# Patient Record
Sex: Female | Born: 1985 | Race: Black or African American | Hispanic: No | Marital: Married | Smoking: Never smoker
Health system: Southern US, Community
[De-identification: ages and names within clinical notes are randomized; demographics above are authoritative.]

## PROBLEM LIST (undated history)

## (undated) ENCOUNTER — Emergency Department: Admission: EM | Payer: Medicaid Other | Source: Home / Self Care

## (undated) ENCOUNTER — Emergency Department: Admission: EM | Payer: Self-pay | Source: Home / Self Care

## (undated) DIAGNOSIS — M5430 Sciatica, unspecified side: Secondary | ICD-10-CM

## (undated) DIAGNOSIS — B20 Human immunodeficiency virus [HIV] disease: Secondary | ICD-10-CM

## (undated) DIAGNOSIS — J45909 Unspecified asthma, uncomplicated: Secondary | ICD-10-CM

## (undated) DIAGNOSIS — Z21 Asymptomatic human immunodeficiency virus [HIV] infection status: Secondary | ICD-10-CM

---
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
## 2015-08-09 ENCOUNTER — Encounter: Payer: Self-pay | Admitting: Emergency Medicine

## 2015-08-09 ENCOUNTER — Emergency Department
Admission: EM | Admit: 2015-08-09 | Discharge: 2015-08-10 | Disposition: A | Discharge status: Home / Self Care | Payer: Medicaid Other | Attending: Emergency Medicine | Admitting: Emergency Medicine

## 2015-08-09 DIAGNOSIS — Z87891 Personal history of nicotine dependence: Secondary | ICD-10-CM | POA: Diagnosis not present

## 2015-08-09 DIAGNOSIS — N390 Urinary tract infection, site not specified: Secondary | ICD-10-CM | POA: Diagnosis not present

## 2015-08-09 DIAGNOSIS — Z3202 Encounter for pregnancy test, result negative: Secondary | ICD-10-CM | POA: Diagnosis not present

## 2015-08-09 DIAGNOSIS — R1030 Lower abdominal pain, unspecified: Secondary | ICD-10-CM | POA: Diagnosis present

## 2015-08-09 HISTORY — DX: Sciatica, unspecified side: M54.30

## 2015-08-09 HISTORY — DX: Unspecified asthma, uncomplicated: J45.909

## 2015-08-09 LAB — CBC
HCT: 37.9 % (ref 35.0–47.0)
Hemoglobin: 12.9 g/dL (ref 12.0–16.0)
MCH: 28.9 pg (ref 26.0–34.0)
MCHC: 34 g/dL (ref 32.0–36.0)
MCV: 84.8 fL (ref 80.0–100.0)
Platelets: 356 10*3/uL (ref 150–440)
RBC: 4.47 MIL/uL (ref 3.80–5.20)
RDW: 12.2 % (ref 11.5–14.5)
WBC: 8.1 10*3/uL (ref 3.6–11.0)

## 2015-08-09 LAB — COMPREHENSIVE METABOLIC PANEL
ALBUMIN: 4.1 g/dL (ref 3.5–5.0)
ALK PHOS: 79 U/L (ref 38–126)
ALT: 11 U/L — ABNORMAL LOW (ref 14–54)
ANION GAP: 6 (ref 5–15)
AST: 21 U/L (ref 15–41)
BILIRUBIN TOTAL: 0.4 mg/dL (ref 0.3–1.2)
BUN: 13 mg/dL (ref 6–20)
CALCIUM: 9.1 mg/dL (ref 8.9–10.3)
CO2: 25 mmol/L (ref 22–32)
Chloride: 105 mmol/L (ref 101–111)
Creatinine, Ser: 0.72 mg/dL (ref 0.44–1.00)
GFR calc Af Amer: >60 mL/min (ref >60)
GLUCOSE: 124 mg/dL — AB (ref 65–99)
Potassium: 3.8 mmol/L (ref 3.5–5.1)
Sodium: 136 mmol/L (ref 135–145)
TOTAL PROTEIN: 8.5 g/dL — AB (ref 6.5–8.1)

## 2015-08-09 LAB — LIPASE, BLOOD: Lipase: 15 U/L (ref 11–51)

## 2015-08-09 NOTE — ED Notes (Signed)
Took tylenol #3 at 430 with no relief.  Mid abd pain constant with low cramps

## 2015-08-10 ENCOUNTER — Emergency Department: Payer: Medicaid Other

## 2015-08-10 LAB — URINALYSIS COMPLETE WITH MICROSCOPIC (ARMC ONLY)
Bilirubin Urine: NEGATIVE
GLUCOSE, UA: NEGATIVE mg/dL
Hgb urine dipstick: NEGATIVE
KETONES UR: NEGATIVE mg/dL
Nitrite: POSITIVE — AB
Protein, ur: NEGATIVE mg/dL
RBC / HPF: NONE SEEN RBC/hpf (ref 0–5)
Specific Gravity, Urine: 1.032 — ABNORMAL HIGH (ref 1.005–1.030)
pH: 5 (ref 5.0–8.0)

## 2015-08-10 LAB — POCT PREGNANCY, URINE: Preg Test, Ur: NEGATIVE

## 2015-08-10 MED ORDER — IOHEXOL 240 MG/ML SOLN
25.0000 mL | Freq: Once | INTRAMUSCULAR | Status: AC | PRN
  Administered 2015-08-10: 25 mL via ORAL

## 2015-08-10 MED ORDER — CEFTRIAXONE SODIUM 1 G IJ SOLR
1.0000 g | Freq: Once | INTRAMUSCULAR | Status: AC
  Administered 2015-08-10: 1 g via INTRAVENOUS
  Filled 2015-08-10: qty 10

## 2015-08-10 MED ORDER — METOCLOPRAMIDE HCL 10 MG PO TABS: 10.0000 mg | ORAL_TABLET | Freq: Three times a day (TID) | ORAL | Status: DC | PRN

## 2015-08-10 MED ORDER — SODIUM CHLORIDE 0.9 % IV BOLUS (SEPSIS)
1000.0000 mL | Freq: Once | INTRAVENOUS | Status: AC
  Administered 2015-08-10: 1000 mL via INTRAVENOUS

## 2015-08-10 MED ORDER — IOHEXOL 300 MG/ML  SOLN
125.0000 mL | Freq: Once | INTRAMUSCULAR | Status: AC | PRN
  Administered 2015-08-10: 125 mL via INTRAVENOUS

## 2015-08-10 MED ORDER — ONDANSETRON HCL 4 MG/2ML IJ SOLN
4.0000 mg | Freq: Once | INTRAMUSCULAR | Status: AC
  Administered 2015-08-10: 4 mg via INTRAVENOUS
  Filled 2015-08-10: qty 2

## 2015-08-10 MED ORDER — MORPHINE SULFATE (PF) 4 MG/ML IV SOLN
4.0000 mg | Freq: Once | INTRAVENOUS | Status: AC
  Administered 2015-08-10: 4 mg via INTRAVENOUS
  Filled 2015-08-10: qty 1

## 2015-08-10 MED ORDER — CEPHALEXIN 500 MG PO CAPS: 500.0000 mg | ORAL_CAPSULE | Freq: Four times a day (QID) | ORAL | Status: AC

## 2015-08-10 MED ORDER — TRAMADOL HCL 50 MG PO TABS: 50.0000 mg | ORAL_TABLET | Freq: Four times a day (QID) | ORAL | Status: DC | PRN

## 2015-08-10 NOTE — ED Notes (Signed)
IV attempt to right AC unsuccessful; pt tolerated well; pt to go for CT when inserted; has already finished oral contrast; CT tech understands waiting for IV placement

## 2015-08-10 NOTE — ED Provider Notes (Signed)
[facility] Emergency Department Provider Note  ____________________________________________  Time seen: Approximately 0019 AM  I have reviewed the triage vital signs and the nursing notes.   HISTORY  Chief Complaint Abdominal Pain    HPI Tracy Bauer is a 30 y.o. female who comes into the hospital today with mid to lower abdominal pain. She reports it is a constant pain in her mid abdomen with some cramps in her lower abdomen. She's had some chills and nausea feeling all day. She reports that she feels as though she has to pass gas and burping. She rates her pain a 9 out of 10 in intensity. The pain started yesterday and has not gotten any better. The patient did take some Tylenol No. 3 but it did not help. She decided to come in and get checked out as her husband will be going to work tomorrow. She denies pain with urination diarrhea or vomiting she also denies vaginal discharge. She's never had this pain before and denies sick contacts but her son has been complaining of belly pain that is worse with eating. The patient also reports that her pain is worse with eating.   Past Medical History  Diagnosis Date  . Sciatica   . Asthma     There are no active problems to display for this patient.   Past Surgical History  Procedure Laterality Date  . Cesarean section      Current Outpatient Rx  Name  Route  Sig  Dispense  Refill  . cephALEXin (KEFLEX) 500 MG capsule   Oral   Take 1 capsule (500 mg total) by mouth 4 (four) times daily.   40 capsule   0   . metoCLOPramide (REGLAN) 10 MG tablet   Oral   Take 1 tablet (10 mg total) by mouth every 8 (eight) hours as needed for nausea.   20 tablet   0   . traMADol (ULTRAM) 50 MG tablet   Oral   Take 1 tablet (50 mg total) by mouth every 6 (six) hours as needed.   12 tablet   0     Allergies Nsaids  History reviewed. No pertinent family history.  Social History Social History  Substance Use  Topics  . Smoking status: Former Games developer  . Smokeless tobacco: None  . Alcohol Use: No    Review of Systems Constitutional: No fever/chills Eyes: No visual changes. ENT: No sore throat. Cardiovascular: Denies chest pain. Respiratory: Denies shortness of breath. Gastrointestinal: abdominal pain.   nausea, no vomiting.  No diarrhea.  No constipation. Genitourinary: Negative for dysuria. Musculoskeletal: Negative for back pain. Skin: Negative for rash. Neurological: Negative for headaches, focal weakness or numbness.  10-point ROS otherwise negative.  ____________________________________________   PHYSICAL EXAM:  VITAL SIGNS: ED Triage Vitals  Enc Vitals Group     BP 08/09/15 2037 130/82 mmHg     Pulse Rate 08/09/15 2037 70     Resp 08/09/15 2037 18     Temp 08/09/15 2037 98.4 F (36.9 C)     Temp Source 08/09/15 2037 Oral     SpO2 08/09/15 2037 98 %     Weight 08/09/15 2037 245 lb (111.131 kg)     Height 08/09/15 2037  (1.651 m)     Head Cir --      Peak Flow --      Pain Score 08/09/15 2038 9     Pain Loc --      Pain Edu? --  Excl. in GC? --     Constitutional: Alert and oriented. Well appearing and in moderate distress. Eyes: Conjunctivae are normal. PERRL. EOMI. Head: Atraumatic. Nose: No congestion/rhinnorhea. Mouth/Throat: Mucous membranes are moist.  Oropharynx non-erythematous. Cardiovascular: Normal rate, regular rhythm. Grossly normal heart sounds.  Good peripheral circulation. Respiratory: Normal respiratory effort.  No retractions. Lungs CTAB. Gastrointestinal: Soft with mid and lower abdominal tenderness to palpation. No distention. Positive bowel sounds Musculoskeletal: No lower extremity tenderness nor edema.   Neurologic:  Normal speech and language.  Skin:  Skin is warm, dry and intact.  Psychiatric: Mood and affect are normal.   ____________________________________________   LABS (all labs ordered are listed, but only abnormal  results are displayed)  Labs Reviewed  COMPREHENSIVE METABOLIC PANEL - Abnormal; Notable for the following:    Glucose, Bld 124 (*)    Total Protein 8.5 (*)    ALT 11 (*)    All other components within normal limits  URINALYSIS COMPLETEWITH MICROSCOPIC ([facility] ONLY) - Abnormal; Notable for the following:    Color, Urine YELLOW (*)    APPearance HAZY (*)    Specific Gravity, Urine 1.032 (*)    Nitrite POSITIVE (*)    Leukocytes, UA TRACE (*)    Bacteria, UA MANY (*)    Squamous Epithelial / LPF 0-5 (*)    All other components within normal limits  URINE CULTURE  LIPASE, BLOOD  CBC  POC URINE PREG, ED  POCT PREGNANCY, URINE   ____________________________________________  EKG  None ____________________________________________  RADIOLOGY  CT Abd and Pelvis: No acute process demonstrated in the abdomen or pelvis. No acute evidence of bowel obstruction or inflammation.  ____________________________________________   PROCEDURES  Procedure(s) performed: None  Critical Care performed: No  ____________________________________________   INITIAL IMPRESSION / ASSESSMENT AND PLAN / ED COURSE  Pertinent labs & imaging results that were available during my care of the patient were reviewed by me and considered in my medical decision making (see chart for details).  This is a 30 year old female who comes into the hospital some lower abdominal cramping and mid abdomen pain. 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 She also does have some nitrates in her urine as well as many bacteria but 0-5 white blood cells. I will do a CT scan to evaluate and send a urine culture. I received the results of the patient's urinalysis I will reassess the patient. She received 4 mg of morphine and 4 mg of Zofran.  The patient's CT scan is unremarkable. Again it does appear as though the patient does have a urinary tract infection. I will send the patient's urine for culture and give her some  ceftriaxone and Keflex for home. The patient will be discharged to home to follow-up with the acute care clinic or her primary care physician. ____________________________________________   FINAL CLINICAL IMPRESSION(S) / ED DIAGNOSES  Final diagnoses:  Lower abdominal pain  UTI (lower urinary tract infection)      Rebecka Apley, MD 08/10/15 [phone]

## 2015-08-10 NOTE — Discharge Instructions (Signed)
Abdominal Pain, Adult °Many things can cause abdominal pain. Usually, abdominal pain is not caused by a disease and will improve without treatment. It can often be observed and treated at home. Your health care provider will do a physical exam and possibly order blood tests and X-rays to help determine the seriousness of your pain. However, in many cases, more time must pass before a clear cause of the pain can be found. Before that point, your health care provider may not know if you need more testing or further treatment. °HOME CARE INSTRUCTIONS °Monitor your abdominal pain for any changes. The following actions may help to alleviate any discomfort you are experiencing: °· Only take over-the-counter or prescription medicines as directed by your health care provider. °· Do not take laxatives unless directed to do so by your health care provider. °· Try a clear liquid diet (broth, tea, or water) as directed by your health care provider. Slowly move to a bland diet as tolerated. °SEEK MEDICAL CARE IF: °· You have unexplained abdominal pain. °· You have abdominal pain associated with nausea or diarrhea. °· You have pain when you urinate or have a bowel movement. °· You experience abdominal pain that wakes you in the night. °· You have abdominal pain that is worsened or improved by eating food. °· You have abdominal pain that is worsened with eating fatty foods. °· You have a fever. °SEEK IMMEDIATE MEDICAL CARE IF: °· Your pain does not go away within 2 hours. °· You keep throwing up (vomiting). °· Your pain is felt only in portions of the abdomen, such as the right side or the left lower portion of the abdomen. °· You pass bloody or black tarry stools. °MAKE SURE YOU: °· Understand these instructions. °· Will watch your condition. °· Will get help right away if you are not doing well or get worse. °  °This information is not intended to replace advice given to you by your health care provider. Make sure you discuss  any questions you have with your health care provider. °  °Document Released: 03/23/2005 Document Revised: 03/04/2015 Document Reviewed: 02/20/2013 °Elsevier Interactive Patient Education ©2016 [employer] °Urinary Tract Infection °Urinary tract infections (UTIs) can develop anywhere along your urinary tract. Your urinary tract is your body's drainage system for removing wastes and extra water. Your urinary tract includes two kidneys, two ureters, a bladder, and a urethra. Your kidneys are a pair of bean-shaped organs. Each kidney is about the size of your fist. They are located below your ribs, one on each side of your spine. °CAUSES °Infections are caused by microbes, which are microscopic organisms, including fungi, viruses, and bacteria. These organisms are so small that they can only be seen through a microscope. Bacteria are the microbes that most commonly cause UTIs. °SYMPTOMS  °Symptoms of UTIs may vary by age and gender of the patient and by the location of the infection. Symptoms in young women typically include a frequent and intense urge to urinate and a painful, burning feeling in the bladder or urethra during urination. Older women and men are more likely to be tired, shaky, and weak and have muscle aches and abdominal pain. A fever may mean the infection is in your kidneys. Other symptoms of a kidney infection include pain in your back or sides below the ribs, nausea, and vomiting. °DIAGNOSIS °To diagnose a UTI, your caregiver will ask you about your symptoms. Your caregiver will also ask you to provide a urine sample. The   urine sample will be tested for bacteria and white blood cells. White blood cells are made by your body to help fight infection. °TREATMENT  °Typically, UTIs can be treated with medication. Because most UTIs are caused by a bacterial infection, they usually can be treated with the use of antibiotics. The choice of antibiotic and length of treatment depend on your symptoms and the  type of bacteria causing your infection. °HOME CARE INSTRUCTIONS °· If you were prescribed antibiotics, take them exactly as your caregiver instructs you. Finish the medication even if you feel better after you have only taken some of the medication. °· Drink enough water and fluids to keep your urine clear or pale yellow. °· Avoid caffeine, tea, and carbonated beverages. They tend to irritate your bladder. °· Empty your bladder often. Avoid holding urine for long periods of time. °· Empty your bladder before and after sexual intercourse. °· After a bowel movement, women should cleanse from front to back. Use each tissue only once. °SEEK MEDICAL CARE IF:  °· You have back pain. °· You develop a fever. °· Your symptoms do not begin to resolve within 3 days. °SEEK IMMEDIATE MEDICAL CARE IF:  °· You have severe back pain or lower abdominal pain. °· You develop chills. °· You have nausea or vomiting. °· You have continued burning or discomfort with urination. °MAKE SURE YOU:  °· Understand these instructions. °· Will watch your condition. °· Will get help right away if you are not doing well or get worse. °  °This information is not intended to replace advice given to you by your health care provider. Make sure you discuss any questions you have with your health care provider. °  °Document Released: 03/23/2005 Document Revised: 03/04/2015 Document Reviewed: 07/22/2011 °Elsevier Interactive Patient Education ©2016 Elsevier Inc. ° °

## 2015-08-12 LAB — URINE CULTURE

## 2016-03-07 ENCOUNTER — Encounter: Payer: Self-pay | Admitting: Emergency Medicine

## 2016-03-07 ENCOUNTER — Emergency Department
Admission: EM | Admit: 2016-03-07 | Discharge: 2016-03-07 | Disposition: A | Discharge status: Home / Self Care | Payer: Medicaid Other | Attending: Emergency Medicine | Admitting: Emergency Medicine

## 2016-03-07 DIAGNOSIS — J45909 Unspecified asthma, uncomplicated: Secondary | ICD-10-CM | POA: Insufficient documentation

## 2016-03-07 DIAGNOSIS — R1032 Left lower quadrant pain: Secondary | ICD-10-CM | POA: Diagnosis present

## 2016-03-07 DIAGNOSIS — Z87891 Personal history of nicotine dependence: Secondary | ICD-10-CM | POA: Insufficient documentation

## 2016-03-07 DIAGNOSIS — K5732 Diverticulitis of large intestine without perforation or abscess without bleeding: Secondary | ICD-10-CM | POA: Diagnosis not present

## 2016-03-07 LAB — URINALYSIS COMPLETE WITH MICROSCOPIC (ARMC ONLY)
BACTERIA UA: NONE SEEN
Bilirubin Urine: NEGATIVE
GLUCOSE, UA: NEGATIVE mg/dL
Hgb urine dipstick: NEGATIVE
KETONES UR: NEGATIVE mg/dL
Leukocytes, UA: NEGATIVE
NITRITE: NEGATIVE
PROTEIN: 30 mg/dL — AB
Specific Gravity, Urine: 1.028 (ref 1.005–1.030)
pH: 5 (ref 5.0–8.0)

## 2016-03-07 LAB — CBC
HCT: 39.3 % (ref 35.0–47.0)
Hemoglobin: 13.4 g/dL (ref 12.0–16.0)
MCH: 28.8 pg (ref 26.0–34.0)
MCHC: 34.1 g/dL (ref 32.0–36.0)
MCV: 84.5 fL (ref 80.0–100.0)
PLATELETS: 325 10*3/uL (ref 150–440)
RBC: 4.65 MIL/uL (ref 3.80–5.20)
RDW: 13.5 % (ref 11.5–14.5)
WBC: 7.8 10*3/uL (ref 3.6–11.0)

## 2016-03-07 LAB — COMPREHENSIVE METABOLIC PANEL
ALT: 11 U/L — ABNORMAL LOW (ref 14–54)
ANION GAP: 7 (ref 5–15)
AST: 22 U/L (ref 15–41)
Albumin: 4.2 g/dL (ref 3.5–5.0)
Alkaline Phosphatase: 81 U/L (ref 38–126)
BUN: 17 mg/dL (ref 6–20)
CHLORIDE: 102 mmol/L (ref 101–111)
CO2: 25 mmol/L (ref 22–32)
Calcium: 9.4 mg/dL (ref 8.9–10.3)
Creatinine, Ser: 0.82 mg/dL (ref 0.44–1.00)
Glucose, Bld: 129 mg/dL — ABNORMAL HIGH (ref 65–99)
Potassium: 3.8 mmol/L (ref 3.5–5.1)
SODIUM: 134 mmol/L — AB (ref 135–145)
Total Bilirubin: 0.2 mg/dL — ABNORMAL LOW (ref 0.3–1.2)
Total Protein: 8.6 g/dL — ABNORMAL HIGH (ref 6.5–8.1)

## 2016-03-07 LAB — POCT PREGNANCY, URINE: PREG TEST UR: NEGATIVE

## 2016-03-07 MED ORDER — METRONIDAZOLE 500 MG PO TABS
ORAL_TABLET | ORAL | Status: AC
  Administered 2016-03-07: 500 mg via ORAL
  Filled 2016-03-07: qty 1

## 2016-03-07 MED ORDER — CIPROFLOXACIN HCL 500 MG PO TABS
500.0000 mg | ORAL_TABLET | Freq: Once | ORAL | Status: AC
  Administered 2016-03-07: 500 mg via ORAL

## 2016-03-07 MED ORDER — METRONIDAZOLE 500 MG PO TABS: 500.0000 mg | ORAL_TABLET | Freq: Two times a day (BID) | ORAL | 0 refills | Status: DC

## 2016-03-07 MED ORDER — METRONIDAZOLE 500 MG PO TABS
500.0000 mg | ORAL_TABLET | Freq: Once | ORAL | Status: AC
  Administered 2016-03-07: 500 mg via ORAL

## 2016-03-07 MED ORDER — ONDANSETRON HCL 4 MG/2ML IJ SOLN
INTRAMUSCULAR | Status: AC
  Administered 2016-03-07: 4 mg via INTRAVENOUS
  Filled 2016-03-07: qty 2

## 2016-03-07 MED ORDER — ONDANSETRON HCL 4 MG PO TABS: 4.0000 mg | ORAL_TABLET | Freq: Every day | ORAL | 1 refills | Status: DC | PRN

## 2016-03-07 MED ORDER — HYDROCODONE-ACETAMINOPHEN 5-325 MG PO TABS: 1.0000 | ORAL_TABLET | ORAL | 0 refills | Status: DC | PRN

## 2016-03-07 MED ORDER — CIPROFLOXACIN HCL 500 MG PO TABS
ORAL_TABLET | ORAL | Status: AC
  Administered 2016-03-07: 500 mg via ORAL
  Filled 2016-03-07: qty 1

## 2016-03-07 MED ORDER — DOCUSATE SODIUM 100 MG PO CAPS: 100.0000 mg | ORAL_CAPSULE | Freq: Two times a day (BID) | ORAL | 0 refills | Status: AC

## 2016-03-07 MED ORDER — ONDANSETRON HCL 4 MG/2ML IJ SOLN
4.0000 mg | Freq: Once | INTRAMUSCULAR | Status: AC
  Administered 2016-03-07: 4 mg via INTRAVENOUS

## 2016-03-07 MED ORDER — CIPROFLOXACIN HCL 500 MG PO TABS: 500.0000 mg | ORAL_TABLET | Freq: Two times a day (BID) | ORAL | 0 refills | Status: DC

## 2016-03-07 NOTE — Discharge Instructions (Signed)
As we discussed, please return to the ED immediately if your symptoms are worsening

## 2016-03-07 NOTE — ED Provider Notes (Signed)
[facility] Emergency Department Provider Note   ____________________________________________    I have reviewed the triage vital signs and the nursing notes.   HISTORY  Chief Complaint Abdominal Pain     HPI Tracy Bauer is a 30 y.o. female who presents with complaints of abdominal painin the left lower quadrant which is moderate in nature. She reports this has gradually developed over several days. No dysuria. No vaginal d/c. No fevers/chills. No n/v. She has never had this before. No trauma to the area. No hx of diverticulitis. She describes the pain as cramping   Past Medical History:  Diagnosis Date  . Asthma   . Sciatica     There are no active problems to display for this patient.   Past Surgical History:  Procedure Laterality Date  . CESAREAN SECTION      Prior to Admission medications   Medication Sig Start Date End Date Taking? Authorizing Provider  ciprofloxacin (CIPRO) 500 MG tablet Take 1 tablet (500 mg total) by mouth 2 (two) times daily. 03/07/16   Jene Every, MD  docusate sodium (COLACE) 100 MG capsule Take 1 capsule (100 mg total) by mouth 2 (two) times daily. 03/07/16 03/07/17  Jene Every, MD  HYDROcodone-acetaminophen (NORCO/VICODIN) 5-325 MG tablet Take 1 tablet by mouth every 4 (four) hours as needed for moderate pain. 03/07/16   Jene Every, MD  metoCLOPramide (REGLAN) 10 MG tablet Take 1 tablet (10 mg total) by mouth every 8 (eight) hours as needed for nausea. 08/10/15 08/09/16  Rebecka Apley, MD  metroNIDAZOLE (FLAGYL) 500 MG tablet Take 1 tablet (500 mg total) by mouth 2 (two) times daily after a meal. 03/07/16   Jene Every, MD  ondansetron (ZOFRAN) 4 MG tablet Take 1 tablet (4 mg total) by mouth daily as needed for nausea or vomiting. 03/07/16   Jene Every, MD  traMADol (ULTRAM) 50 MG tablet Take 1 tablet (50 mg total) by mouth every 6 (six) hours as needed. 08/10/15   Rebecka Apley, MD      Allergies Nsaids  No family history on file.  Social History Social History  Substance Use Topics  . Smoking status: Former Games developer  . Smokeless tobacco: Not on file     Comment: vapes  . Alcohol use No    Review of Systems  Constitutional: No fever/chills  ENT: No sore throat. Cardiovascular: Denies chest pain. Respiratory: Denies shortness of breath. Gastrointestinal: as above Genitourinary: Negative for dysuria. Musculoskeletal: Negative for back pain. Skin: Negative for rash. Neurological: Negative for headache  10-point ROS otherwise negative.  ____________________________________________   PHYSICAL EXAM:  VITAL SIGNS: ED Triage Vitals  Enc Vitals Group     BP 03/07/16 0826 128/86     Pulse Rate 03/07/16 0826 100     Resp 03/07/16 0826 18     Temp 03/07/16 0826 98.2 F (36.8 C)     Temp Source 03/07/16 0826 Oral     SpO2 03/07/16 0826 100 %     Weight 03/07/16 0827 248 lb (112.5 kg)     Height 03/07/16 0827 5\' 5"  (1.651 m)     Head Circumference --      Peak Flow --      Pain Score 03/07/16 0828 8     Pain Loc --      Pain Edu? --      Excl. in GC? --     Constitutional: Alert and oriented. No acute distress. Pleasant and interactive Eyes:  Conjunctivae are normal.  Head: Atraumatic. Nose: No congestion/rhinnorhea. Mouth/Throat: Mucous membranes are moist.    Cardiovascular: Normal rate, regular rhythm. Grossly normal heart sounds.  Good peripheral circulation. Respiratory: Normal respiratory effort.  No retractions. Lungs CTAB. Gastrointestinal: moderate ttp in the LLQ.  No distention.  No CVA tenderness. Genitourinary: deferred Musculoskeletal: No lower extremity tenderness nor edema.  Warm and well perfused Neurologic:  Normal speech and language. No gross focal neurologic deficits are appreciated.  Skin:  Skin is warm, dry and intact. No rash noted. Psychiatric: Mood and affect are normal. Speech and behavior are  normal.  ____________________________________________   LABS (all labs ordered are listed, but only abnormal results are displayed)  Labs Reviewed  COMPREHENSIVE METABOLIC PANEL - Abnormal; Notable for the following:       Result Value   Sodium 134 (*)    Glucose, Bld 129 (*)    Total Protein 8.6 (*)    ALT 11 (*)    Total Bilirubin 0.2 (*)    All other components within normal limits  URINALYSIS COMPLETEWITH MICROSCOPIC ([facility] ONLY) - Abnormal; Notable for the following:    Color, Urine YELLOW (*)    APPearance CLEAR (*)    Protein, ur 30 (*)    Squamous Epithelial / LPF 0-5 (*)    All other components within normal limits  CBC  POCT PREGNANCY, URINE   ____________________________________________  EKG   ____________________________________________  RADIOLOGY  Recommended CT but patient refused because had to pick up kids ____________________________________________   PROCEDURES  Procedure(s) performed: No    Critical Care performed: No ____________________________________________   INITIAL IMPRESSION / ASSESSMENT AND PLAN / ED COURSE  Pertinent labs & imaging results that were available during my care of the patient were reviewed by me and considered in my medical decision making (see chart for details).  Patient with llq ttp, highly suspicious for diverticulitis. Recommended CT but patient does not have time to wait currently. We decided that we would treat her as presumptive diverticulitis with strict return precautions given that she cannot stay for imaging at this time. She agrees with this plan and notes she will come back promptly as needed.  Clinical Course   ____________________________________________   FINAL CLINICAL IMPRESSION(S) / ED DIAGNOSES  Final diagnoses:  Diverticulitis of large intestine without perforation or abscess without bleeding      NEW MEDICATIONS STARTED DURING THIS VISIT:  Discharge Medication List as of 03/07/2016  11:32 AM    START taking these medications   Details  ciprofloxacin (CIPRO) 500 MG tablet Take 1 tablet (500 mg total) by mouth 2 (two) times daily., Starting Mon 03/07/2016, Print    docusate sodium (COLACE) 100 MG capsule Take 1 capsule (100 mg total) by mouth 2 (two) times daily., Starting Mon 03/07/2016, Until Tue 03/07/2017, Print    HYDROcodone-acetaminophen (NORCO/VICODIN) 5-325 MG tablet Take 1 tablet by mouth every 4 (four) hours as needed for moderate pain., Starting Mon 03/07/2016, Print    metroNIDAZOLE (FLAGYL) 500 MG tablet Take 1 tablet (500 mg total) by mouth 2 (two) times daily after a meal., Starting Mon 03/07/2016, Print    ondansetron (ZOFRAN) 4 MG tablet Take 1 tablet (4 mg total) by mouth daily as needed for nausea or vomiting., Starting Mon 03/07/2016, Print         Note:  This document was prepared using Dragon voice recognition software and may include unintentional dictation errors.    Jene Everyobert Jabreel Chimento, MD 03/07/16 [phone]

## 2016-03-07 NOTE — ED Triage Notes (Signed)
Lower L abd pain began 0530 today.

## 2016-08-31 ENCOUNTER — Encounter: Payer: Self-pay | Admitting: *Deleted

## 2016-08-31 DIAGNOSIS — J45909 Unspecified asthma, uncomplicated: Secondary | ICD-10-CM | POA: Diagnosis not present

## 2016-08-31 DIAGNOSIS — R102 Pelvic and perineal pain: Secondary | ICD-10-CM | POA: Diagnosis present

## 2016-08-31 DIAGNOSIS — N739 Female pelvic inflammatory disease, unspecified: Secondary | ICD-10-CM | POA: Diagnosis not present

## 2016-08-31 DIAGNOSIS — Z87891 Personal history of nicotine dependence: Secondary | ICD-10-CM | POA: Insufficient documentation

## 2016-08-31 LAB — COMPREHENSIVE METABOLIC PANEL
ALT: 12 U/L — ABNORMAL LOW (ref 14–54)
ANION GAP: 6 (ref 5–15)
AST: 21 U/L (ref 15–41)
Albumin: 4.1 g/dL (ref 3.5–5.0)
Alkaline Phosphatase: 67 U/L (ref 38–126)
BUN: 17 mg/dL (ref 6–20)
CHLORIDE: 104 mmol/L (ref 101–111)
CO2: 26 mmol/L (ref 22–32)
Calcium: 9 mg/dL (ref 8.9–10.3)
Creatinine, Ser: 0.86 mg/dL (ref 0.44–1.00)
Glucose, Bld: 128 mg/dL — ABNORMAL HIGH (ref 65–99)
POTASSIUM: 3.3 mmol/L — AB (ref 3.5–5.1)
SODIUM: 136 mmol/L (ref 135–145)
Total Bilirubin: 0.4 mg/dL (ref 0.3–1.2)
Total Protein: 8.6 g/dL — ABNORMAL HIGH (ref 6.5–8.1)

## 2016-08-31 LAB — URINALYSIS, COMPLETE (UACMP) WITH MICROSCOPIC
BILIRUBIN URINE: NEGATIVE
GLUCOSE, UA: NEGATIVE mg/dL
Hgb urine dipstick: NEGATIVE
KETONES UR: 5 mg/dL — AB
LEUKOCYTES UA: NEGATIVE
Nitrite: NEGATIVE
PH: 5 (ref 5.0–8.0)
PROTEIN: NEGATIVE mg/dL
Specific Gravity, Urine: 1.029 (ref 1.005–1.030)

## 2016-08-31 LAB — CBC
HCT: 35.8 % (ref 35.0–47.0)
HEMOGLOBIN: 12.1 g/dL (ref 12.0–16.0)
MCH: 28.1 pg (ref 26.0–34.0)
MCHC: 33.7 g/dL (ref 32.0–36.0)
MCV: 83.3 fL (ref 80.0–100.0)
Platelets: 336 10*3/uL (ref 150–440)
RBC: 4.29 MIL/uL (ref 3.80–5.20)
RDW: 13.6 % (ref 11.5–14.5)
WBC: 9.5 10*3/uL (ref 3.6–11.0)

## 2016-08-31 LAB — POCT PREGNANCY, URINE: Preg Test, Ur: NEGATIVE

## 2016-08-31 NOTE — ED Triage Notes (Signed)
Pt has low abd pain for 2 weeks.  No vag discharge.  No vag bleeding  No dysuria.  No n/v/d.     Pt alert.

## 2016-09-01 ENCOUNTER — Emergency Department: Payer: Medicaid Other

## 2016-09-01 ENCOUNTER — Telehealth: Payer: Self-pay | Admitting: Emergency Medicine

## 2016-09-01 ENCOUNTER — Emergency Department
Admission: EM | Admit: 2016-09-01 | Discharge: 2016-09-01 | Disposition: A | Discharge status: Home / Self Care | Payer: Medicaid Other | Attending: Emergency Medicine | Admitting: Emergency Medicine

## 2016-09-01 DIAGNOSIS — N730 Acute parametritis and pelvic cellulitis: Secondary | ICD-10-CM

## 2016-09-01 LAB — CHLAMYDIA/NGC RT PCR (ARMC ONLY)
Chlamydia Tr: NOT DETECTED
N gonorrhoeae: NOT DETECTED

## 2016-09-01 LAB — WET PREP, GENITAL
SPERM: NONE SEEN
TRICH WET PREP: NONE SEEN
Yeast Wet Prep HPF POC: NONE SEEN

## 2016-09-01 MED ORDER — AZITHROMYCIN 500 MG PO TABS
1000.0000 mg | ORAL_TABLET | Freq: Once | ORAL | Status: AC
  Administered 2016-09-01: 1000 mg via ORAL
  Filled 2016-09-01: qty 2

## 2016-09-01 MED ORDER — FENTANYL CITRATE (PF) 100 MCG/2ML IJ SOLN
25.0000 ug | Freq: Once | INTRAMUSCULAR | Status: AC
  Administered 2016-09-01: 25 ug via INTRAVENOUS
  Filled 2016-09-01: qty 2

## 2016-09-01 MED ORDER — LIDOCAINE HCL (PF) 1 % IJ SOLN
INTRAMUSCULAR | Status: AC
  Administered 2016-09-01: 0.9 mL
  Filled 2016-09-01: qty 5

## 2016-09-01 MED ORDER — DOXYCYCLINE HYCLATE 50 MG PO CAPS: 50.0000 mg | ORAL_CAPSULE | Freq: Two times a day (BID) | ORAL | 0 refills | Status: AC

## 2016-09-01 MED ORDER — ONDANSETRON HCL 4 MG/2ML IJ SOLN
4.0000 mg | Freq: Once | INTRAMUSCULAR | Status: AC
  Administered 2016-09-01: 4 mg via INTRAVENOUS
  Filled 2016-09-01: qty 2

## 2016-09-01 MED ORDER — IOPAMIDOL (ISOVUE-300) INJECTION 61%
100.0000 mL | Freq: Once | INTRAVENOUS | Status: AC | PRN
  Administered 2016-09-01: 100 mL via INTRAVENOUS

## 2016-09-01 MED ORDER — IOPAMIDOL (ISOVUE-300) INJECTION 61%
30.0000 mL | Freq: Once | INTRAVENOUS | Status: AC | PRN
Start: 2016-09-01 — End: 2016-09-01
  Administered 2016-09-01: 30 mL via ORAL

## 2016-09-01 MED ORDER — CEFTRIAXONE SODIUM 250 MG IJ SOLR
250.0000 mg | Freq: Once | INTRAMUSCULAR | Status: AC
  Administered 2016-09-01: 250 mg via INTRAMUSCULAR
  Filled 2016-09-01: qty 250

## 2016-09-01 NOTE — ED Notes (Signed)

## 2016-09-01 NOTE — ED Notes (Signed)
Patient transported to CT 

## 2016-09-01 NOTE — ED Notes (Signed)
Pt states hx of multiple C-Sections as well as diverticulitis, and "is not sure which ovary was removed." Pt also states hx of having cysts on ovary that "bled internally and was removed."

## 2016-09-01 NOTE — ED Provider Notes (Signed)
[facility] Emergency Department Provider Note   First MD Initiated Contact with Patient 09/01/16 0151     (approximate)  I have reviewed the triage vital signs and the nursing notes.   HISTORY  Chief Complaint Abdominal Pain    HPI Tracy Bauer is a 31 y.o. female with local list of chronic medical conditions presents to the emergency department with left lower quadrant abdominal/pelvic pain 2 weeks that is currently 8 out of 10. Patient denies any nausea vomiting or diarrhea. Patient denies any constipation. Patient denies any vaginal discharge or vaginal bleeding. Of note patient was seen last year for similar complaint emergency department and diagnosed with diverticulitis no CT scan was performed at that time.   Past Medical History:  Diagnosis Date  . Asthma   . Sciatica     There are no active problems to display for this patient.   Past Surgical History:  Procedure Laterality Date  . CESAREAN SECTION      Prior to Admission medications   Medication Sig Start Date End Date Taking? Authorizing Provider  ciprofloxacin (CIPRO) 500 MG tablet Take 1 tablet (500 mg total) by mouth 2 (two) times daily. 03/07/16   Jene Every, MD  docusate sodium (COLACE) 100 MG capsule Take 1 capsule (100 mg total) by mouth 2 (two) times daily. 03/07/16 03/07/17  Jene Every, MD  doxycycline (VIBRAMYCIN) 50 MG capsule Take 1 capsule (50 mg total) by mouth 2 (two) times daily. 09/01/16 09/15/16  Darci Current, MD  HYDROcodone-acetaminophen (NORCO/VICODIN) 5-325 MG tablet Take 1 tablet by mouth every 4 (four) hours as needed for moderate pain. 03/07/16   Jene Every, MD  metoCLOPramide (REGLAN) 10 MG tablet Take 1 tablet (10 mg total) by mouth every 8 (eight) hours as needed for nausea. 08/10/15 08/09/16  Rebecka Apley, MD  metroNIDAZOLE (FLAGYL) 500 MG tablet Take 1 tablet (500 mg total) by mouth 2 (two) times daily after a meal. 03/07/16   Jene Every, MD    ondansetron (ZOFRAN) 4 MG tablet Take 1 tablet (4 mg total) by mouth daily as needed for nausea or vomiting. 03/07/16   Jene Every, MD  traMADol (ULTRAM) 50 MG tablet Take 1 tablet (50 mg total) by mouth every 6 (six) hours as needed. 08/10/15   Rebecka Apley, MD    Allergies Nsaids  No family history on file.  Social History Social History  Substance Use Topics  . Smoking status: Former Games developer  . Smokeless tobacco: Never Used     Comment: vapes  . Alcohol use No    Review of Systems Constitutional: No fever/chills Eyes: No visual changes. ENT: No sore throat. Cardiovascular: Denies chest pain. Respiratory: Denies shortness of breath. Gastrointestinal: Positive for abdominal pain.  No nausea, no vomiting.  No diarrhea.  No constipation. Genitourinary: Negative for dysuria. Musculoskeletal: Negative for back pain. Skin: Negative for rash. Neurological: Negative for headaches, focal weakness or numbness.  10-point ROS otherwise negative.  ____________________________________________   PHYSICAL EXAM:  VITAL SIGNS: ED Triage Vitals  Enc Vitals Group     BP 08/31/16 2252 (!) 108/56     Pulse Rate 08/31/16 2249 92     Resp 08/31/16 2249 20     Temp 08/31/16 2249 98.1 F (36.7 C)     Temp Source 08/31/16 2249 Oral     SpO2 08/31/16 2249 99 %     Weight 08/31/16 2250 263 lb (119.3 kg)     Height 08/31/16 2250 5'  5" (1.651 m)     Head Circumference --      Peak Flow --      Pain Score 08/31/16 2250 8     Pain Loc --      Pain Edu? --      Excl. in GC? --     Constitutional: Alert and oriented. Apparent discomfort Eyes: Conjunctivae are normal. PERRL. EOMI. Head: Atraumatic. Ears:  Healthy appearing ear canals and TMs bilaterally Nose: No congestion/rhinnorhea. Mouth/Throat: Mucous membranes are moist. Oropharynx non-erythematous. Neck: No stridor.  No meningeal signs.  No cervical spine tenderness to palpation. Cardiovascular: Normal rate, regular rhythm.  Good peripheral circulation. Grossly normal heart sounds. Respiratory: Normal respiratory effort.  No retractions. Lungs CTAB. Gastrointestinal: Soft and nontender. No distention.  Genitourinary: Scant clear frothy vaginal discharge, positive cervical motion tenderness Musculoskeletal: No lower extremity tenderness nor edema. No gross deformities of extremities. Neurologic:  Normal speech and language. No gross focal neurologic deficits are appreciated.  Skin:  Skin is warm, dry and intact. No rash noted. Psychiatric: Mood and affect are normal. Speech and behavior are normal.  ____________________________________________   LABS (all labs ordered are listed, but only abnormal results are displayed)  Labs Reviewed  COMPREHENSIVE METABOLIC PANEL - Abnormal; Notable for the following:       Result Value   Potassium 3.3 (*)    Glucose, Bld 128 (*)    Total Protein 8.6 (*)    ALT 12 (*)    All other components within normal limits  URINALYSIS, COMPLETE (UACMP) WITH MICROSCOPIC - Abnormal; Notable for the following:    Color, Urine YELLOW (*)    APPearance CLEAR (*)    Ketones, ur 5 (*)    Bacteria, UA RARE (*)    Squamous Epithelial / LPF 0-5 (*)    All other components within normal limits  CHLAMYDIA/NGC RT PCR ([facility] ONLY)  WET PREP, GENITAL  CBC  URINALYSIS, COMPLETE (UACMP) WITH MICROSCOPIC  POC URINE PREG, ED  POCT PREGNANCY, URINE   _  RADIOLOGY I,  N Dorotea Hand, personally viewed and evaluated these images (plain radiographs) as part of my medical decision making, as well as reviewing the written report by the radiologist.  Ct Abdomen Pelvis W Contrast  Result Date: 09/01/2016 CLINICAL DATA:  Lower abdominal pain for 2 weeks. EXAM: CT ABDOMEN AND PELVIS WITH CONTRAST TECHNIQUE: Multidetector CT imaging of the abdomen and pelvis was performed using the standard protocol following bolus administration of intravenous contrast. CONTRAST:  100 mL Isovue-300  intravenous COMPARISON:  08/10/2015 FINDINGS: Lower chest: No acute abnormality. Hepatobiliary: No focal liver abnormality is seen. No gallstones, gallbladder wall thickening, or biliary dilatation. Pancreas: Unremarkable. No pancreatic ductal dilatation or surrounding inflammatory changes. Spleen: Normal in size without focal abnormality. Adrenals/Urinary Tract: Adrenal glands are unremarkable. Kidneys are normal, without renal calculi, focal lesion, or hydronephrosis. Bladder is unremarkable. Stomach/Bowel: Stomach is within normal limits. Appendix is normal. No evidence of bowel wall thickening, distention, or inflammatory changes. Vascular/Lymphatic: No significant vascular findings are present. No enlarged abdominal or pelvic lymph nodes. Reproductive: Uterus and bilateral adnexa are unremarkable. Other: No acute inflammation. No ascites. Small fat containing umbilical hernia. Musculoskeletal: No significant skeletal lesion. IMPRESSION: No significant abnormality. Electronically Signed   By: Ellery Plunk M.D.   On: 09/01/2016 03:50      Procedures     INITIAL IMPRESSION / ASSESSMENT AND PLAN / ED COURSE  Pertinent labs & imaging results that were available during my care of the  patient were reviewed by me and considered in my medical decision making (see chart for details).  Given history of pelvic pain with negative CT scan unrelieved with previous treatment for presumed diverticulitis concern for possible PID as such vaginal exam was performed and the patient was positive for cervical motion tenderness. Given this finding patient will be treated for pelvic inflammatory disease. Patient does recall having gonorrhea chlamydia during "her teenage years".      ____________________________________________  FINAL CLINICAL IMPRESSION(S) / ED DIAGNOSES  Final diagnoses:  PID (acute pelvic inflammatory disease)     MEDICATIONS GIVEN DURING THIS VISIT:  Medications  fentaNYL  (SUBLIMAZE) injection 25 mcg (25 mcg Intravenous Given 09/01/16 0236)  ondansetron (ZOFRAN) injection 4 mg (4 mg Intravenous Given 09/01/16 0236)  iopamidol (ISOVUE-300) 61 % injection 30 mL (30 mLs Oral Contrast Given 09/01/16 0240)  iopamidol (ISOVUE-300) 61 % injection 100 mL (100 mLs Intravenous Contrast Given 09/01/16 0335)  cefTRIAXone (ROCEPHIN) injection 250 mg (250 mg Intramuscular Given 09/01/16 0556)  azithromycin (ZITHROMAX) tablet 1,000 mg (1,000 mg Oral Given 09/01/16 0541)  lidocaine (PF) (XYLOCAINE) 1 % injection (0.9 mLs  Given 09/01/16 0600)     NEW OUTPATIENT MEDICATIONS STARTED DURING THIS VISIT:  New Prescriptions   DOXYCYCLINE (VIBRAMYCIN) 50 MG CAPSULE    Take 1 capsule (50 mg total) by mouth 2 (two) times daily.    Modified Medications   No medications on file    Discontinued Medications   No medications on file     Note:  This document was prepared using Dragon voice recognition software and may include unintentional dictation errors.    Darci Currentandolph N Calinda Stockinger, MD 09/01/16 [phone]

## 2016-09-01 NOTE — Telephone Encounter (Addendum)
Called patient due to wet prep result.  Had dr Pershing Proudschaevitz review and he would like patient to take flagyl 500 mg twice a day for 7 days.  I left message asking patient to call me.    pateint called me back.  I called the med to [facility] [address].

## 2016-09-01 NOTE — ED Notes (Signed)
Pelvic cart placed at pt's bedside

## 2016-10-01 ENCOUNTER — Encounter: Payer: Self-pay | Admitting: Emergency Medicine

## 2016-10-01 ENCOUNTER — Emergency Department
Admission: EM | Admit: 2016-10-01 | Discharge: 2016-10-01 | Disposition: A | Discharge status: Home / Self Care | Payer: Medicaid Other | Attending: Emergency Medicine | Admitting: Emergency Medicine

## 2016-10-01 DIAGNOSIS — N9489 Other specified conditions associated with female genital organs and menstrual cycle: Secondary | ICD-10-CM | POA: Insufficient documentation

## 2016-10-01 DIAGNOSIS — J45909 Unspecified asthma, uncomplicated: Secondary | ICD-10-CM | POA: Insufficient documentation

## 2016-10-01 DIAGNOSIS — N938 Other specified abnormal uterine and vaginal bleeding: Secondary | ICD-10-CM

## 2016-10-01 DIAGNOSIS — F17200 Nicotine dependence, unspecified, uncomplicated: Secondary | ICD-10-CM | POA: Insufficient documentation

## 2016-10-01 LAB — HCG, QUANTITATIVE, PREGNANCY

## 2016-10-01 NOTE — ED Provider Notes (Signed)
[facility] Emergency Department Provider Note  ____________________________________________   First MD Initiated Contact with Patient 10/01/16 1726     (approximate)  I have reviewed the triage vital signs and the nursing notes.   HISTORY  Chief Complaint Vaginal Bleeding   HPI Tracy Bauer is a 31 y.o. female with a history of HIV with an undetectable viral load who is presenting to the emergency department today with vaginal bleeding. She says that she took a pregnancy test at home as well that was positive and she was concerned because of the bleeding with pregnancy. She does have a history of an ectopic pregnancy that required an oophorectomy. She is denying any abdominal pain at this time. Says that she has had nausea. Says that she has used 4 pads today and has not soaked them. Denies any clots. Says that she has a regular period that usually comes about the 11th of the month so this will be earlier than normal for her.   Past Medical History:  Diagnosis Date  . Asthma   . Sciatica     There are no active problems to display for this patient.   Past Surgical History:  Procedure Laterality Date  . CESAREAN SECTION      Prior to Admission medications   Medication Sig Start Date End Date Taking? Authorizing Provider  ciprofloxacin (CIPRO) 500 MG tablet Take 1 tablet (500 mg total) by mouth 2 (two) times daily. 03/07/16   Jene Every, MD  docusate sodium (COLACE) 100 MG capsule Take 1 capsule (100 mg total) by mouth 2 (two) times daily. 03/07/16 03/07/17  Jene Every, MD  HYDROcodone-acetaminophen (NORCO/VICODIN) 5-325 MG tablet Take 1 tablet by mouth every 4 (four) hours as needed for moderate pain. 03/07/16   Jene Every, MD  metoCLOPramide (REGLAN) 10 MG tablet Take 1 tablet (10 mg total) by mouth every 8 (eight) hours as needed for nausea. 08/10/15 08/09/16  Rebecka Apley, MD  metroNIDAZOLE (FLAGYL) 500 MG tablet Take 1 tablet (500 mg total)  by mouth 2 (two) times daily after a meal. 03/07/16   Jene Every, MD  ondansetron (ZOFRAN) 4 MG tablet Take 1 tablet (4 mg total) by mouth daily as needed for nausea or vomiting. 03/07/16   Jene Every, MD  traMADol (ULTRAM) 50 MG tablet Take 1 tablet (50 mg total) by mouth every 6 (six) hours as needed. 08/10/15   Rebecka Apley, MD    Allergies Nsaids  No family history on file.  Social History Social History  Substance Use Topics  . Smoking status: Current Every Day Smoker  . Smokeless tobacco: Current User     Comment: vapes  . Alcohol use No    Review of Systems Constitutional: No fever/chills Eyes: No visual changes. ENT: No sore throat. Cardiovascular: Denies chest pain. Respiratory: Denies shortness of breath. Gastrointestinal: No abdominal pain.  No nausea, no vomiting.  No diarrhea.  No constipation. Genitourinary: Negative for dysuria. Musculoskeletal: Negative for back pain. Skin: Negative for rash. Neurological: Negative for headaches, focal weakness or numbness.  10-point ROS otherwise negative.  ____________________________________________   PHYSICAL EXAM:  VITAL SIGNS: ED Triage Vitals  Enc Vitals Group     BP 10/01/16 1718 137/82     Pulse Rate 10/01/16 1718 98     Resp 10/01/16 1718 18     Temp 10/01/16 1718 98.3 F (36.8 C)     Temp Source 10/01/16 1718 Oral     SpO2 10/01/16 1718 100 %  Weight 10/01/16 1721 240 lb (108.9 kg)     Height 10/01/16 1721  (1.651 m)     Head Circumference --      Peak Flow --      Pain Score 10/01/16 1718 7     Pain Loc --      Pain Edu? --      Excl. in GC? --     Constitutional: Alert and oriented. Well appearing and in no acute distress. Eyes: Conjunctivae are normal. PERRL. EOMI. Head: Atraumatic. Nose: No congestion/rhinnorhea. Mouth/Throat: Mucous membranes are moist. Neck: No stridor.   Cardiovascular: Normal rate, regular rhythm. Grossly normal heart sounds.  Respiratory: Normal  respiratory effort.  No retractions. Lungs CTAB. Gastrointestinal: Soft and nontender. No distention Genitourinary:  Normal external appearance without any lesions. Speculum exam without active bleeding from the cervical os. There is a small amount of pooling within the vagina. Bimanual exam with a closed cervical os. No CMT. No uterine nor adnexal tenderness nor masses. Musculoskeletal: No lower extremity tenderness nor edema.  No joint effusions. Neurologic:  Normal speech and language. No gross focal neurologic deficits are appreciated. No gait instability. Skin:  Skin is warm, dry and intact. No rash noted. Psychiatric: Mood and affect are normal. Speech and behavior are normal.  ____________________________________________   LABS (all labs ordered are listed, but only abnormal results are displayed)  Labs Reviewed  HCG, QUANTITATIVE, PREGNANCY   ____________________________________________  EKG   ____________________________________________  RADIOLOGY   ____________________________________________   PROCEDURES  Procedure(s) performed:   Procedures  Critical Care performed:   ____________________________________________   INITIAL IMPRESSION / ASSESSMENT AND PLAN / ED COURSE  Pertinent labs & imaging results that were available during my care of the patient were reviewed by me and considered in my medical decision making (see chart for details).  ----------------------------------------- 6:27 PM on 10/01/2016 -----------------------------------------  Patient with a negative hCG hormone level. Recommend follow-up at the [facility] clinic OB/GYN with the patient is seen for further evaluation. We discussed return precautions including any worsening or concerning symptoms including persistent bleeding or development of pain, dizziness, weakness or any other worsening or concerning symptoms. The patient is understanding of this plan and willing to comply.       ____________________________________________   FINAL CLINICAL IMPRESSION(S) / ED DIAGNOSES  Dysfunctional uterine bleeding.    NEW MEDICATIONS STARTED DURING THIS VISIT:  New Prescriptions   No medications on file     Note:  This document was prepared using Dragon voice recognition software and may include unintentional dictation errors.    Myrna Blazer, MD 10/01/16 [phone]

## 2016-10-01 NOTE — ED Notes (Signed)
Pt took a pregnancy test yesterday morning and it came back positive.  Pt has bled through 5 pads since 2am this morning.  Pt denies cramps.

## 2016-10-01 NOTE — ED Triage Notes (Signed)
States got positive home pregnancy test yesterday, states today began period approx as much as period.

## 2016-12-02 ENCOUNTER — Ambulatory Visit: Payer: Medicaid Other | Admitting: Dietician

## 2016-12-09 ENCOUNTER — Encounter: Payer: Self-pay | Admitting: Dietician

## 2016-12-09 NOTE — Progress Notes (Signed)
Patient did not show up for her initial MNT appointment, her MD was notified.

## 2017-04-01 ENCOUNTER — Encounter: Payer: Self-pay | Admitting: Emergency Medicine

## 2017-04-01 ENCOUNTER — Emergency Department
Admission: EM | Admit: 2017-04-01 | Discharge: 2017-04-01 | Disposition: A | Discharge status: Home / Self Care | Payer: Medicaid Other | Attending: Internal Medicine | Admitting: Internal Medicine

## 2017-04-01 DIAGNOSIS — J029 Acute pharyngitis, unspecified: Secondary | ICD-10-CM | POA: Diagnosis present

## 2017-04-01 DIAGNOSIS — J45909 Unspecified asthma, uncomplicated: Secondary | ICD-10-CM | POA: Diagnosis not present

## 2017-04-01 DIAGNOSIS — F17290 Nicotine dependence, other tobacco product, uncomplicated: Secondary | ICD-10-CM | POA: Diagnosis not present

## 2017-04-01 DIAGNOSIS — J020 Streptococcal pharyngitis: Secondary | ICD-10-CM | POA: Insufficient documentation

## 2017-04-01 MED ORDER — DEXAMETHASONE SODIUM PHOSPHATE 10 MG/ML IJ SOLN
10.0000 mg | Freq: Once | INTRAMUSCULAR | Status: AC
  Administered 2017-04-01: 10 mg via INTRAMUSCULAR
  Filled 2017-04-01: qty 1

## 2017-04-01 MED ORDER — ACETAMINOPHEN 325 MG PO TABS
650.0000 mg | ORAL_TABLET | Freq: Once | ORAL | Status: DC
  Filled 2017-04-01: qty 2

## 2017-04-01 MED ORDER — TRIAMCINOLONE ACETONIDE 0.025 % EX OINT: 1.0000 "application " | TOPICAL_OINTMENT | Freq: Two times a day (BID) | CUTANEOUS | 0 refills | Status: DC

## 2017-04-01 MED ORDER — AMOXICILLIN 500 MG PO TABS: 500.0000 mg | ORAL_TABLET | Freq: Two times a day (BID) | ORAL | 0 refills | Status: AC

## 2017-04-01 NOTE — ED Provider Notes (Signed)
[facility] Emergency Department Provider Note  ____________________________________________  Time seen: Approximately 7:21 PM  I have reviewed the triage vital signs and the nursing notes.   HISTORY  Chief Complaint Sore Throat    HPI Tracy Bauer is a 31 y.o. female with a history of HIV presents to the emergency department with pharyngitis, headache, fever, chills and anterior neck discomfort for the past 2 days. Patient denies chest pain, chest tightness, shortness breath, nausea, vomiting or abdominal pain. Patient secondarily reports eczema at flexural creases of left upper extremity. Patient has been taking Tylenol. Patient is speaking in complete sentences and tolerating fluids by mouth as well as her own secretions.    Past Medical History:  Diagnosis Date  . Asthma   . Sciatica     There are no active problems to display for this patient.   Past Surgical History:  Procedure Laterality Date  . CESAREAN SECTION      Prior to Admission medications   Medication Sig Start Date End Date Taking? Authorizing Provider  amoxicillin (AMOXIL) 500 MG tablet Take 1 tablet (500 mg total) by mouth 2 (two) times daily. 04/01/17 04/11/17  Orvil Feil, PA-C  ciprofloxacin (CIPRO) 500 MG tablet Take 1 tablet (500 mg total) by mouth 2 (two) times daily. 03/07/16   Jene Every, MD  HYDROcodone-acetaminophen (NORCO/VICODIN) 5-325 MG tablet Take 1 tablet by mouth every 4 (four) hours as needed for moderate pain. 03/07/16   Jene Every, MD  metoCLOPramide (REGLAN) 10 MG tablet Take 1 tablet (10 mg total) by mouth every 8 (eight) hours as needed for nausea. 08/10/15 08/09/16  Rebecka Apley, MD  metroNIDAZOLE (FLAGYL) 500 MG tablet Take 1 tablet (500 mg total) by mouth 2 (two) times daily after a meal. 03/07/16   Jene Every, MD  ondansetron (ZOFRAN) 4 MG tablet Take 1 tablet (4 mg total) by mouth daily as needed for nausea or vomiting. 03/07/16   Jene Every, MD  traMADol (ULTRAM) 50 MG tablet Take 1 tablet (50 mg total) by mouth every 6 (six) hours as needed. 08/10/15   Rebecka Apley, MD  triamcinolone (KENALOG) 0.025 % ointment Apply 1 application topically 2 (two) times daily. 04/01/17   Orvil Feil, PA-C    Allergies Nsaids  History reviewed. No pertinent family history.  Social History Social History  Substance Use Topics  . Smoking status: Current Every Day Smoker    Types: E-cigarettes  . Smokeless tobacco: Current User     Comment: vapes  . Alcohol use No     Review of Systems  Constitutional: Patient has fever and chills.  Eyes: No visual changes. No discharge ENT: Patient has headache and pharyngitis  Cardiovascular: no chest pain. Respiratory: no cough. No SOB. Gastrointestinal: No abdominal pain.  No nausea, no vomiting.  No diarrhea.  No constipation. Musculoskeletal: Negative for musculoskeletal pain. Skin: Patient has eczema. Neurological: Negative for headaches, focal weakness or numbness.   ____________________________________________   PHYSICAL EXAM:  VITAL SIGNS: ED Triage Vitals  Enc Vitals Group     BP --      Pulse Rate 04/01/17 1753 (!) 119     Resp 04/01/17 1753 18     Temp 04/01/17 1753 98.1 F (36.7 C)     Temp src --      SpO2 04/01/17 1753 98 %     Weight 04/01/17 1758 263 lb (119.3 kg)     Height 04/01/17 1758 5' 5.5" (1.664 m)  Head Circumference --      Peak Flow --      Pain Score 04/01/17 1824 10     Pain Loc --      Pain Edu? --      Excl. in GC? --      Constitutional: Alert and oriented. Well appearing and in no acute distress. Eyes: Conjunctivae are normal. PERRL. EOMI. Head: Atraumatic. ENT:      Ears: Tympanic membranes are pearly bilaterally.      Nose: No congestion/rhinnorhea.      Mouth/Throat: Mucous membranes are moist. Posterior pharynx is erythematous with bilateral tonsillar exudate. Uvula is midline.  Hematological/Lymphatic/Immunilogical:  Palpable cervical lymphadenopathy. Cardiovascular: Normal rate, regular rhythm. Normal S1 and S2.  Good peripheral circulation. Respiratory: Normal respiratory effort without tachypnea or retractions. Lungs CTAB. Good air entry to the bases with no decreased or absent breath sounds. Gastrointestinal: Bowel sounds 4 quadrants. Soft and nontender to palpation. No guarding or rigidity. No palpable masses. No distention. No CVA tenderness. Skin:  Skin is warm, dry and intact. No rash noted. Psychiatric: Mood and affect are normal. Speech and behavior are normal. Patient exhibits appropriate insight and judgement.   ____________________________________________   LABS (all labs ordered are listed, but only abnormal results are displayed)  Labs Reviewed  CULTURE, GROUP A STREP [facility])   ____________________________________________  EKG   ____________________________________________  RADIOLOGY   No results found.  ____________________________________________    PROCEDURES  Procedure(s) performed:    Procedures    Medications  acetaminophen (TYLENOL) tablet 650 mg (650 mg Oral Refused 04/01/17 1902)  dexamethasone (DECADRON) injection 10 mg (not administered)     ____________________________________________   INITIAL IMPRESSION / ASSESSMENT AND PLAN / ED COURSE  Pertinent labs & imaging results that were available during my care of the patient were reviewed by me and considered in my medical decision making (see chart for details).  Review of the [place] CSRS was performed in accordance of the [facility] prior to dispensing any controlled drugs.    Assessment and Plan: Strep Pharyngitis: Patient presents to the emergency department with headache, pharyngitis, fever, palpable cervical lymphadenopathy and absence of cough. Patient was discharged with amoxicillin. Patient was given an injection of decadron in the emergency department.       ____________________________________________  FINAL CLINICAL IMPRESSION(S) / ED DIAGNOSES  Final diagnoses:  Pharyngitis due to Streptococcus species      NEW MEDICATIONS STARTED DURING THIS VISIT:  New Prescriptions   AMOXICILLIN (AMOXIL) 500 MG TABLET    Take 1 tablet (500 mg total) by mouth 2 (two) times daily.   TRIAMCINOLONE (KENALOG) 0.025 % OINTMENT    Apply 1 application topically 2 (two) times daily.        This chart was dictated using voice recognition software/Dragon. Despite best efforts to proofread, errors can occur which can change the meaning. Any change was purely unintentional.    Orvil Feil, PA-C 04/01/17 George Ina, MD 04/02/17 [phone]

## 2017-04-01 NOTE — ED Notes (Signed)
Pt states taking nyquil and tylenol for pain with no relief. Strep negative and culture sent. Pt states her son had sore throat and then she and her daughter got sore throats. Pt also has hx of tonsil stones. Does not have an ent - states she "digs the stones out" until it feels better and is requesting the er dr dig the "white patch out"

## 2017-04-03 LAB — CULTURE, GROUP A STREP (THRC)

## 2017-06-05 ENCOUNTER — Other Ambulatory Visit: Payer: Self-pay

## 2017-06-05 ENCOUNTER — Emergency Department: Payer: Medicaid Other

## 2017-06-05 ENCOUNTER — Emergency Department
Admission: EM | Admit: 2017-06-05 | Discharge: 2017-06-05 | Disposition: A | Discharge status: Home / Self Care | Payer: Medicaid Other | Attending: Emergency Medicine | Admitting: Emergency Medicine

## 2017-06-05 ENCOUNTER — Encounter: Payer: Self-pay | Admitting: *Deleted

## 2017-06-05 DIAGNOSIS — Y939 Activity, unspecified: Secondary | ICD-10-CM | POA: Diagnosis not present

## 2017-06-05 DIAGNOSIS — Y998 Other external cause status: Secondary | ICD-10-CM | POA: Insufficient documentation

## 2017-06-05 DIAGNOSIS — Y929 Unspecified place or not applicable: Secondary | ICD-10-CM | POA: Insufficient documentation

## 2017-06-05 DIAGNOSIS — Y33XXXA Other specified events, undetermined intent, initial encounter: Secondary | ICD-10-CM | POA: Insufficient documentation

## 2017-06-05 DIAGNOSIS — S99911A Unspecified injury of right ankle, initial encounter: Secondary | ICD-10-CM | POA: Diagnosis present

## 2017-06-05 DIAGNOSIS — B20 Human immunodeficiency virus [HIV] disease: Secondary | ICD-10-CM | POA: Diagnosis not present

## 2017-06-05 DIAGNOSIS — F17210 Nicotine dependence, cigarettes, uncomplicated: Secondary | ICD-10-CM | POA: Diagnosis not present

## 2017-06-05 DIAGNOSIS — Z79899 Other long term (current) drug therapy: Secondary | ICD-10-CM | POA: Diagnosis not present

## 2017-06-05 DIAGNOSIS — J45909 Unspecified asthma, uncomplicated: Secondary | ICD-10-CM | POA: Diagnosis not present

## 2017-06-05 DIAGNOSIS — S93491A Sprain of other ligament of right ankle, initial encounter: Secondary | ICD-10-CM

## 2017-06-05 DIAGNOSIS — S93431A Sprain of tibiofibular ligament of right ankle, initial encounter: Secondary | ICD-10-CM | POA: Diagnosis not present

## 2017-06-05 HISTORY — DX: Asymptomatic human immunodeficiency virus (hiv) infection status: Z21

## 2017-06-05 HISTORY — DX: Human immunodeficiency virus (HIV) disease: B20

## 2017-06-05 MED ORDER — DEXAMETHASONE SODIUM PHOSPHATE 10 MG/ML IJ SOLN
10.0000 mg | Freq: Once | INTRAMUSCULAR | Status: AC
  Administered 2017-06-05: 10 mg via INTRAMUSCULAR
  Filled 2017-06-05: qty 1

## 2017-06-05 MED ORDER — PREDNISONE 10 MG PO TABS: 10.0000 mg | ORAL_TABLET | Freq: Every day | ORAL | 0 refills | Status: DC

## 2017-06-05 NOTE — ED Provider Notes (Signed)
[facility] Emergency Department Provider Note  ____________________________________________  Time seen: Approximately 10:10 PM  I have reviewed the triage vital signs and the nursing notes.   HISTORY  Chief Complaint Foot Pain    HPI Tracy Bauer is a 31 y.o. female who presents the emergency department complaining of right ankle pain.  Patient reports that she was outside on the ice and snow, slipped, twisting her ankle in an inversion manner.  Patient reports pain and swelling to the anterolateral aspect of her ankle.  She is unable to bear weight on her ankle.  Patient reports during this injury she did not fall or hit her head.  No loss of consciousness.  Patient is not taking any medications for her complaint prior to arrival.  Patient has a history of asthma, HIV, sciatica.  No complaints with chronic medical problems.  Past Medical History:  Diagnosis Date  . Asthma   . HIV (human immunodeficiency virus infection) (HCC)   . Sciatica     There are no active problems to display for this patient.   Past Surgical History:  Procedure Laterality Date  . CESAREAN SECTION      Prior to Admission medications   Medication Sig Start Date End Date Taking? Authorizing Provider  ciprofloxacin (CIPRO) 500 MG tablet Take 1 tablet (500 mg total) by mouth 2 (two) times daily. 03/07/16   Jene EveryKinner, Robert, MD  HYDROcodone-acetaminophen (NORCO/VICODIN) 5-325 MG tablet Take 1 tablet by mouth every 4 (four) hours as needed for moderate pain. 03/07/16   Jene EveryKinner, Robert, MD  metoCLOPramide (REGLAN) 10 MG tablet Take 1 tablet (10 mg total) by mouth every 8 (eight) hours as needed for nausea. 08/10/15 08/09/16  Rebecka ApleyWebster, Allison P, MD  metroNIDAZOLE (FLAGYL) 500 MG tablet Take 1 tablet (500 mg total) by mouth 2 (two) times daily after a meal. 03/07/16   Jene EveryKinner, Robert, MD  ondansetron (ZOFRAN) 4 MG tablet Take 1 tablet (4 mg total) by mouth daily as needed for nausea or vomiting.  03/07/16   Jene EveryKinner, Robert, MD  predniSONE (DELTASONE) 10 MG tablet Take 1 tablet (10 mg total) by mouth daily. 06/05/17   Cuthriell, Delorise RoyalsJonathan D, PA-C  traMADol (ULTRAM) 50 MG tablet Take 1 tablet (50 mg total) by mouth every 6 (six) hours as needed. 08/10/15   Rebecka ApleyWebster, Allison P, MD  triamcinolone (KENALOG) 0.025 % ointment Apply 1 application topically 2 (two) times daily. 04/01/17   Orvil FeilWoods, Jaclyn M, PA-C    Allergies Nsaids  History reviewed. No pertinent family history.  Social History Social History   Tobacco Use  . Smoking status: Current Every Day Smoker    Types: E-cigarettes  . Smokeless tobacco: Current User  . Tobacco comment: vapes  Substance Use Topics  . Alcohol use: No  . Drug use: Not on file     Review of Systems  Constitutional: No fever/chills Eyes: No visual changes.  Cardiovascular: no chest pain. Respiratory: no cough. No SOB. Gastrointestinal: No abdominal pain.  No nausea, no vomiting.   Musculoskeletal: Right ankle pain Skin: Negative for rash, abrasions, lacerations, ecchymosis. Neurological: Negative for headaches, focal weakness or numbness. 10-point ROS otherwise negative.  ____________________________________________   PHYSICAL EXAM:  VITAL SIGNS: ED Triage Vitals  Enc Vitals Group     BP 06/05/17 1928 122/85     Pulse Rate 06/05/17 1928 85     Resp 06/05/17 1928 18     Temp 06/05/17 1928 98.4 F (36.9 C)     Temp Source  06/05/17 1928 Oral     SpO2 06/05/17 1928 99 %     Weight 06/05/17 1925 264 lb (119.7 kg)     Height 06/05/17 1925 5' 5.5" (1.664 m)     Head Circumference --      Peak Flow --      Pain Score 06/05/17 1925 10     Pain Loc --      Pain Edu? --      Excl. in [place]? --      Constitutional: Alert and oriented. Well appearing and in no acute distress. Eyes: Conjunctivae are normal. PERRL. EOMI. Head: Atraumatic. Neck: No stridor.    Cardiovascular: Normal rate, regular rhythm. Normal S1 and S2.  Good peripheral  circulation. Respiratory: Normal respiratory effort without tachypnea or retractions. Lungs CTAB. Good air entry to the bases with no decreased or absent breath sounds. Musculoskeletal: Full range of motion to all extremities. No gross deformities appreciated.  No gross deformity noted to right ankle.  Mild edema is appreciated to the talonavicular joint line region.  Patient has full range of motion to the ankle.  The patient is tender to palpation along the anterior talofibular ligament distribution and over the talonavicular joint line with no palpable abnormality.  No tenderness to palpation over bilateral malleolus is.  Dorsalis pedis pulse intact.  Sensation intact all 5 digits. Neurologic:  Normal speech and language. No gross focal neurologic deficits are appreciated.  Skin:  Skin is warm, dry and intact. No rash noted. Psychiatric: Mood and affect are normal. Speech and behavior are normal. Patient exhibits appropriate insight and judgement.   ____________________________________________   LABS (all labs ordered are listed, but only abnormal results are displayed)  Labs Reviewed - No data to display ____________________________________________  EKG   ____________________________________________  RADIOLOGY Festus BarrenI, Jonathan D Cuthriell, personally viewed and evaluated these images (plain radiographs) as part of my medical decision making, as well as reviewing the written report by the radiologist.  Dg Ankle Complete Right  Result Date: 06/05/2017 CLINICAL DATA:  Fall with injury. EXAM: RIGHT ANKLE - COMPLETE 3+ VIEW COMPARISON:  None. FINDINGS: There is no evidence of fracture, dislocation, or joint effusion. There is no evidence of arthropathy or other focal bone abnormality. Soft tissues are unremarkable. IMPRESSION: Negative. Electronically Signed   By: Signa Kellaylor  Stroud M.D.   On: 06/05/2017 19:57    ____________________________________________    PROCEDURES  Procedure(s)  performed:    Procedures    Medications  dexamethasone (DECADRON) injection 10 mg (not administered)     ____________________________________________   INITIAL IMPRESSION / ASSESSMENT AND PLAN / ED COURSE  Pertinent labs & imaging results that were available during my care of the patient were reviewed by me and considered in my medical decision making (see chart for details).  Review of the  CSRS was performed in accordance of the [facility] prior to dispensing any controlled drugs.     Patient's diagnosis is consistent with right ankle sprain.  Differential included fracture versus sprain versus ligament rupture.  Exam is reassuring.  X-ray reveals no acute osseous abnormality.  Patient is given Ace bandage and crutches in the emergency department.. Patient will be discharged home with prescriptions for course of steroids as patient is allergic to NSAIDs.. Patient is to follow up with primary care or orthopedics as needed or otherwise directed. Patient is given ED precautions to return to the ED for any worsening or new symptoms.     ____________________________________________  FINAL CLINICAL IMPRESSION(S) /  ED DIAGNOSES  Final diagnoses:  Sprain of anterior talofibular ligament of right ankle, initial encounter      NEW MEDICATIONS STARTED DURING THIS VISIT:  ED Discharge Orders        Ordered    predniSONE (DELTASONE) 10 MG tablet  Daily    Comments:  Take 6 pills x 2 days, 5 pills x 2 days, 4 pills x 2 days, 3 pills x 2 days, 2 pills x 2 days, and 1 pill x 2 days   06/05/17 2218          This chart was dictated using voice recognition software/Dragon. Despite best efforts to proofread, errors can occur which can change the meaning. Any change was purely unintentional.    Racheal Patches, PA-C 06/05/17 2225    Emily Filbert, MD 06/05/17 [phone]

## 2017-06-05 NOTE — ED Triage Notes (Signed)
Pt to ED reporting have twisted right ankle on ice today. Pt reports she fell at 1530 and has had increasing pin in right foot, ankle, and leg since.Small amount of swelling noted at this time and pt reports increased pain with movement and pressure. No deformity or discoloration noted.

## 2017-06-05 NOTE — ED Notes (Signed)

## 2017-06-05 NOTE — ED Notes (Signed)
Pt stated that she slipped on snow and twisted her ankle (perhaps outward but is not positive) around 3:30 this afternoon. Reports pain is 8/10 on top of foot.

## 2018-12-02 ENCOUNTER — Encounter: Payer: Self-pay | Admitting: Intensive Care

## 2018-12-02 ENCOUNTER — Emergency Department
Admission: EM | Admit: 2018-12-02 | Discharge: 2018-12-02 | Disposition: A | Discharge status: Home / Self Care | Payer: Medicaid Other | Attending: Emergency Medicine | Admitting: Emergency Medicine

## 2018-12-02 ENCOUNTER — Other Ambulatory Visit: Payer: Self-pay

## 2018-12-02 ENCOUNTER — Emergency Department: Payer: Medicaid Other

## 2018-12-02 DIAGNOSIS — J45909 Unspecified asthma, uncomplicated: Secondary | ICD-10-CM | POA: Insufficient documentation

## 2018-12-02 DIAGNOSIS — Z21 Asymptomatic human immunodeficiency virus [HIV] infection status: Secondary | ICD-10-CM | POA: Insufficient documentation

## 2018-12-02 DIAGNOSIS — Y939 Activity, unspecified: Secondary | ICD-10-CM | POA: Insufficient documentation

## 2018-12-02 DIAGNOSIS — Z87891 Personal history of nicotine dependence: Secondary | ICD-10-CM | POA: Insufficient documentation

## 2018-12-02 DIAGNOSIS — Z79899 Other long term (current) drug therapy: Secondary | ICD-10-CM | POA: Insufficient documentation

## 2018-12-02 DIAGNOSIS — X500XXA Overexertion from strenuous movement or load, initial encounter: Secondary | ICD-10-CM | POA: Insufficient documentation

## 2018-12-02 DIAGNOSIS — S63614A Unspecified sprain of right ring finger, initial encounter: Secondary | ICD-10-CM

## 2018-12-02 DIAGNOSIS — S63616A Unspecified sprain of right little finger, initial encounter: Secondary | ICD-10-CM

## 2018-12-02 DIAGNOSIS — Y929 Unspecified place or not applicable: Secondary | ICD-10-CM | POA: Insufficient documentation

## 2018-12-02 DIAGNOSIS — Y990 Civilian activity done for income or pay: Secondary | ICD-10-CM | POA: Insufficient documentation

## 2018-12-02 MED ORDER — PREDNISONE 50 MG PO TABS: 50.0000 mg | ORAL_TABLET | Freq: Every day | ORAL | 0 refills | Status: DC

## 2018-12-02 NOTE — ED Triage Notes (Signed)
Patient reports jamming her right hand pinky ate work. Unable to put gloves on at work

## 2018-12-02 NOTE — ED Provider Notes (Signed)
[facility] Emergency Department Provider Note  ____________________________________________  Time seen: Approximately 7:14 PM  I have reviewed the triage vital signs and the nursing notes.   HISTORY  Chief Complaint Hand Pain (pinky)    HPI Tracy Bauer is a 33 y.o. female who presents emergency department for injury to the fourth and fifth digit of the right hand.  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  Patient reports that she has had pain, swelling to both digits.  She is able to flex and extend the distal aspect of the finger with limited range of motion due to pain.  Patient did have a ring to the fourth digit but was able to remove same.  No other injury or complaint.  No medications prior to arrival.         Past Medical History:  Diagnosis Date  . Asthma   . HIV (human immunodeficiency virus infection) (HCC)   . Sciatica     There are no active problems to display for this patient.   Past Surgical History:  Procedure Laterality Date  . CESAREAN SECTION      Prior to Admission medications   Medication Sig Start Date End Date Taking? Authorizing Provider  ciprofloxacin (CIPRO) 500 MG tablet Take 1 tablet (500 mg total) by mouth 2 (two) times daily. 03/07/16   Jene EveryKinner, Robert, MD  HYDROcodone-acetaminophen (NORCO/VICODIN) 5-325 MG tablet Take 1 tablet by mouth every 4 (four) hours as needed for moderate pain. 03/07/16   Jene EveryKinner, Robert, MD  metoCLOPramide (REGLAN) 10 MG tablet Take 1 tablet (10 mg total) by mouth every 8 (eight) hours as needed for nausea. 08/10/15 08/09/16  Rebecka ApleyWebster, Allison P, MD  metroNIDAZOLE (FLAGYL) 500 MG tablet Take 1 tablet (500 mg total) by mouth 2 (two) times daily after a meal. 03/07/16   Jene EveryKinner, Robert, MD  ondansetron (ZOFRAN) 4 MG tablet Take 1 tablet (4 mg total) by mouth daily as needed for nausea or vomiting. 03/07/16   Jene EveryKinner, Robert, MD   predniSONE (DELTASONE) 50 MG tablet Take 1 tablet (50 mg total) by mouth daily with breakfast. 12/02/18   , Delorise RoyalsJonathan D, PA-C  traMADol (ULTRAM) 50 MG tablet Take 1 tablet (50 mg total) by mouth every 6 (six) hours as needed. 08/10/15   Rebecka ApleyWebster, Allison P, MD  triamcinolone (KENALOG) 0.025 % ointment Apply 1 application topically 2 (two) times daily. 04/01/17   Orvil FeilWoods, Jaclyn M, PA-C    Allergies Nsaids  History reviewed. No pertinent family history.  Social History Social History   Tobacco Use  . Smoking status: Former Smoker    Types: E-cigarettes  . Smokeless tobacco: Current User  . Tobacco comment: vapes  Substance Use Topics  . Alcohol use: No  . Drug use: Never     Review of Systems  Constitutional: No fever/chills Eyes: No visual changes.  Cardiovascular: no chest pain. Respiratory: no cough. No SOB. Gastrointestinal: No abdominal pain.  No nausea, no vomiting.  Musculoskeletal: Positive for pain/injury to the fourth and fifth digits occurred at work. Skin: Negative for rash, abrasions, lacerations, ecchymosis. Neurological: Negative for headaches, focal weakness or numbness. 10-point ROS otherwise negative.  ____________________________________________   PHYSICAL EXAM:  VITAL SIGNS: ED Triage Vitals  Enc Vitals Group     BP 12/02/18 1823 (!) 128/91     Pulse Rate 12/02/18 1823 90     Resp 12/02/18 1823 14     Temp  12/02/18 1823 98.5 F (36.9 C)     Temp Source 12/02/18 1823 Oral     SpO2 12/02/18 1823 98 %     Weight 12/02/18 1824 276 lb (125.2 kg)     Height 12/02/18 1824 5' 5.5" (1.664 m)     Head Circumference --      Peak Flow --      Pain Score 12/02/18 1823 7     Pain Loc --      Pain Edu? --      Excl. in [place]? --      Constitutional: Alert and oriented. Well appearing and in no acute distress. Eyes: Conjunctivae are normal. PERRL. EOMI. Head: Atraumatic. ENT:      Ears:       Nose: No congestion/rhinnorhea.      Mouth/Throat:  Mucous membranes are moist.  Neck: No stridor.    Cardiovascular: Normal rate, regular rhythm. Normal S1 and S2.  Good peripheral circulation. Respiratory: Normal respiratory effort without tachypnea or retractions. Lungs CTAB. Good air entry to the bases with no decreased or absent breath sounds. Musculoskeletal: Full range of motion to all extremities. No gross deformities appreciated.  Examination of the fourth and fifth digit right hand reveals moderate edema of both digits.  No obvious deformity.  Patient is able to flex and extend the distal aspect of both digits appropriately.  Sensation and cap refill intact bilaterally.  Patient is having mild tenderness to palpation along the extensor tendon of both the fourth and fifth digit.  No tenderness to palpation of the osseous structures of the hand. Neurologic:  Normal speech and language. No gross focal neurologic deficits are appreciated.  Skin:  Skin is warm, dry and intact. No rash noted. Psychiatric: Mood and affect are normal. Speech and behavior are normal. Patient exhibits appropriate insight and judgement.   ____________________________________________   LABS (all labs ordered are listed, but only abnormal results are displayed)  Labs Reviewed - No data to display ____________________________________________  EKG   ____________________________________________  RADIOLOGY I personally viewed and evaluated these images as part of my medical decision making, as well as reviewing the written report by the radiologist.  I concur with radiologist finding of no acute osseous abnormality to the fourth and fifth digit of the right hand  Dg Hand Complete Right  Result Date: 12/02/2018 CLINICAL DATA:  Patient reports jamming her right hand pinky ate work. Unable to put gloves on at work. Pain in 4th and 5th digits radiating up to her wrist. 2 laterals included due to pt's inability to seperate her 4th and 5th digits. EXAM: RIGHT HAND -  COMPLETE 3+ VIEW COMPARISON:  None. FINDINGS: No acute fracture or dislocation.  No definite soft tissue swelling. IMPRESSION: No acute osseous abnormality. Electronically Signed   By: Abigail Miyamoto M.D.   On: 12/02/2018 18:54    ____________________________________________    PROCEDURES  Procedure(s) performed:    .Splint Application Date/Time: 06/30/4313 7:16 PM Performed by: Darletta Moll, PA-C Authorized by: Darletta Moll, PA-C   Consent:    Consent obtained:  Verbal   Consent given by:  Patient   Risks discussed:  Pain and swelling Pre-procedure details:    Sensation:  Normal Procedure details:    Laterality:  Right   Location:  Finger   Finger:  R ring finger and R small finger   Splint type:  Finger   Supplies:  Aluminum splint and elastic bandage Post-procedure details:    Pain:  Improved   Sensation:  Normal   Patient tolerance of procedure:  Tolerated well, no immediate complications      Medications - No data to display   ____________________________________________   INITIAL IMPRESSION / ASSESSMENT AND PLAN / ED COURSE  Pertinent labs & imaging results that were available during my care of the patient were reviewed by me and considered in my medical decision making (see chart for details).  Review of the [place] CSRS was performed in accordance of the [facility] prior to dispensing any controlled drugs.           Patient's diagnosis is consistent with sprain to fourth and fifth digits.  Patient presented to the emergency department complaining of pain to the fourth and fifth digit of the right hand after injury at work.  Overall exam is reassuring.  X-ray reveals no acute osseous abnormality.  Findings are most consistent with a sprain of both digits.  Patient is placed in a splint, referred to orthopedics for further management.  Patient is allergic to NSAIDs and will be placed on a short course of steroid..  Patient is given ED precautions to  return to the ED for any worsening or new symptoms.     ____________________________________________  FINAL CLINICAL IMPRESSION(S) / ED DIAGNOSES  Final diagnoses:  Sprain of right ring finger, unspecified site of finger, initial encounter  Sprain of right little finger, unspecified site of finger, initial encounter      NEW MEDICATIONS STARTED DURING THIS VISIT:  ED Discharge Orders         Ordered    predniSONE (DELTASONE) 50 MG tablet  Daily with breakfast     12/02/18 1917              This chart was dictated using voice recognition software/Dragon. Despite best efforts to proofread, errors can occur which can change the meaning. Any change was purely unintentional.    Racheal PatchesCuthriell,  D, PA-C 12/02/18 1930    Arnaldo NatalMalinda, Paul F, MD 12/02/18 2352

## 2018-12-02 NOTE — ED Notes (Signed)
When informed drug screen will be required, pt declines to file under worker's compensation. Tracy Bauer, with registration witnessed pt's declination with this RN.

## 2019-06-18 ENCOUNTER — Ambulatory Visit
Admission: EM | Admit: 2019-06-18 | Discharge: 2019-06-18 | Disposition: A | Discharge status: Home / Self Care | Payer: Self-pay

## 2019-06-18 ENCOUNTER — Other Ambulatory Visit: Payer: Self-pay

## 2019-06-18 ENCOUNTER — Encounter: Payer: Self-pay | Admitting: Emergency Medicine

## 2019-06-18 DIAGNOSIS — H748X3 Other specified disorders of middle ear and mastoid, bilateral: Secondary | ICD-10-CM

## 2019-06-18 DIAGNOSIS — R42 Dizziness and giddiness: Secondary | ICD-10-CM

## 2019-06-18 DIAGNOSIS — H6593 Unspecified nonsuppurative otitis media, bilateral: Secondary | ICD-10-CM

## 2019-06-18 MED ORDER — MECLIZINE HCL 25 MG PO TABS: 25.0000 mg | ORAL_TABLET | Freq: Three times a day (TID) | ORAL | 0 refills | Status: AC | PRN

## 2019-06-18 NOTE — ED Triage Notes (Signed)
Patient in today c/o dizziness since yesterday. Patient has a history of vertigo. Patient states she needs a note for work today. Patient states the dizziness is better today than yesterday.

## 2019-06-18 NOTE — Discharge Instructions (Addendum)
It was very nice seeing you today in clinic. Thank you for entrusting me with your care.   Rest and increase your fluid intake. Use the medication for dizziness that I sent in for you. You have fluid behind both ears, which could also make your dizziness worse. I would recommend some Sudafed or Zyrtec to help dry up the fluid.   Make arrangements to follow up with your regular doctor in 1 week for re-evaluation if not improving. If your symptoms/condition worsens, please seek follow up care either here or in the ER. Please remember, our [place] providers are "right here with you" when you need [place].   Again, it was my pleasure to take care of you today. Thank you for choosing our clinic. I hope that you start to feel better quickly.   Honor Loh, MSN, APRN, FNP-C, CEN Advanced Practice Provider [facility]

## 2019-06-19 NOTE — ED Provider Notes (Signed)
Bauer, Tracy   Name: Tracy Bauer DOB: 07-11-1985 MRN: [id number] CSN: [id number] PCP: Tracy Sell, MD  Arrival date and time:  06/18/19 1707  Chief Complaint:  Dizziness   NOTE: Prior to seeing the patient today, I have reviewed the triage nursing documentation and vital signs. Clinical staff has updated patient's PMH/PSHx, current medication list, and drug allergies/intolerances to ensure comprehensive history available to assist in medical decision making.   History:   HPI: Tracy Bauer is a 33 y.o. female who presents today with complaints of vertiginous symptoms that started with acute onset yesterday. Patient denies recent upper respiratory symptoms; no cough, rhinorrhea, fever, or otalgia. She denies head trauma. Patient eating and drinking well. She has not experienced any associated nausea, vomiting, or diarrhea. Patient denies focal neurological symptoms; no headache, weakness, visual changes, or changes to her speech. PMH (+) for vertigo, however patient does not currently take any type of interventions. Patient presents today asking for intervention and documentation for her job allowing her to be out of work. Despite her symptoms, patient has not taken any over the counter interventions to help improve/relieve her reported symptoms at home.   Past Medical History:  Diagnosis Date  . Asthma   . HIV (human immunodeficiency virus infection) (HCC)   . Sciatica     Past Surgical History:  Procedure Laterality Date  . CESAREAN SECTION      Family History  Problem Relation Age of Onset  . Diabetes Mother   . Hypertension Mother   . Other Father        unknown medical history    Social History   Tobacco Use  . Smoking status: Never Smoker  . Smokeless tobacco: Never Used  . Tobacco comment: vapes  Substance Use Topics  . Alcohol use: No  . Drug use: Never    There are no problems to display for this patient.   Home Medications:    Current Meds   Medication Sig  . Elviteg-Cobic-Emtricit-TenofAF (GENVOYA PO) Take 1 tablet by mouth daily.    Allergies:   Nsaids and Penicillin g  Review of Systems (ROS): Review of Systems  Constitutional: Negative for chills and fever.  HENT: Negative for congestion, ear discharge, ear pain, rhinorrhea, sinus pressure, sinus pain and sore throat.   Respiratory: Negative for cough and shortness of breath.   Cardiovascular: Negative for chest pain and palpitations.  Gastrointestinal: Negative for abdominal pain, diarrhea, nausea and vomiting.  Musculoskeletal: Negative for back pain, gait problem, joint swelling, neck pain and neck stiffness.  Skin: Negative for color change, pallor and rash.  Neurological: Positive for dizziness. Negative for syncope, weakness, numbness and headaches.  All other systems reviewed and are negative.    Vital Signs: Today's Vitals   06/18/19 1751 06/18/19 1752 06/18/19 1824  BP:  123/80   Pulse:  76   Resp:  16   Temp:  98.1 F (36.7 C)   TempSrc:  Oral   SpO2:  100%   Weight: 270 lb (122.5 kg)    Height: 5' 5.5" (1.664 m)    PainSc: 0-No pain  0-No pain    Physical Exam: Physical Exam  Constitutional: She is oriented to person, place, and time and well-developed, well-nourished, and in no distress.  HENT:  Head: Normocephalic and atraumatic.  Right Ear: A middle ear effusion is present.  Left Ear: A middle ear effusion is present.  Nose: Nose normal.  Mouth/Throat: Uvula is midline, oropharynx is clear and moist  and mucous membranes are normal.  Eyes: Pupils are equal, round, and reactive to light.  Cardiovascular: Normal rate, regular rhythm, normal heart sounds and intact distal pulses.  Pulmonary/Chest: Effort normal and breath sounds normal.  Neurological: She is alert and oriented to person, place, and time. She has normal sensation, normal strength, normal reflexes and intact cranial nerves. Gait normal.  Skin: Skin is warm and dry. No rash  noted. She is not diaphoretic.  Psychiatric: Mood, memory, affect and judgment normal.  Nursing note and vitals reviewed.   Urgent Care Treatments / Results:   No orders of the defined types were placed in this encounter.   LABS: PLEASE NOTE: all labs that were ordered this encounter are listed, however only abnormal results are displayed. Labs Reviewed - No data to display  EKG: -None  RADIOLOGY: No results found.  PROCEDURES: Procedures  MEDICATIONS RECEIVED THIS VISIT: Medications - No data to display  PERTINENT CLINICAL COURSE NOTES/UPDATES:   Initial Impression / Assessment and Plan / Urgent Care Course:  Pertinent labs & imaging results that were available during my care of the patient were personally reviewed by me and considered in my medical decision making (see lab/imaging section of note for values and interpretations).  Tracy Bauer is a 33 y.o. female who presents to [facility] today with complaints of Dizziness   Patient is well appearing overall in clinic today. She does not appear to be in any acute distress. Presenting symptoms (see HPI) and exam as documented above. Symptoms consistent with known vertigo. Exam also reveals BILATERAL serous ear effusions. Recommended Sudafed or Zyrtec to help dry up effusions. Patient encouraged to increase fluid intake to ensure adequate hydration. Will also send in a supply of meclizine for PRN use.   Current clinical condition warrants patient being out of work in order to recover from her current injury/illness. She was provided with the appropriate documentation to provide to her place of employment that will allow for her to RTW on 06/21/2019 with no restrictions.   Discussed follow up with primary care physician in 1 week for re-evaluation. I have reviewed the follow up and strict return precautions for any new or worsening symptoms. Patient is aware of symptoms that would be deemed urgent/emergent, and would thus  require further evaluation either here or in the emergency department. At the time of discharge, she verbalized understanding and consent with the discharge plan as it was reviewed with her. All questions were fielded by provider and/or clinic staff prior to patient discharge.    Final Clinical Impressions / Urgent Care Diagnoses:   Final diagnoses:  Vertigo  MEE (middle ear effusion), bilateral    New Prescriptions:  [place]rolled [place] Registry consulted? Not Applicable  Meds ordered this encounter  Medications  . meclizine (ANTIVERT) 25 MG tablet    Sig: Take 1 tablet (25 mg total) by mouth 3 (three) times daily as needed for dizziness.    Dispense:  30 tablet    Refill:  0    Recommended Follow up Care:  Patient encouraged to follow up with the following provider within the specified time frame, or sooner as dictated by the severity of her symptoms. As always, she was instructed that for any urgent/emergent care needs, she should seek care either here or in the emergency department for more immediate evaluation.  Follow-up Information    Leonel Ramsay, MD In 1 week.   Specialty: Infectious Diseases Why: General reassessment of symptoms if not  improving Contact information: [address] [place] [address] [phone]         NOTE: This note was prepared using Dragon dictation software along with smaller phrase technology. Despite my best ability to proofread, there is the potential that transcriptional errors may still occur from this process, and are completely unintentional.    Verlee MonteGray, Braleigh Massoud E, NP 06/19/19 1610

## 2019-07-25 ENCOUNTER — Encounter: Payer: Self-pay | Admitting: Emergency Medicine

## 2019-07-25 ENCOUNTER — Emergency Department: Payer: Self-pay

## 2019-07-25 ENCOUNTER — Other Ambulatory Visit: Payer: Self-pay

## 2019-07-25 ENCOUNTER — Emergency Department
Admission: EM | Admit: 2019-07-25 | Discharge: 2019-07-25 | Disposition: A | Discharge status: Home / Self Care | Payer: Self-pay | Attending: Emergency Medicine | Admitting: Emergency Medicine

## 2019-07-25 DIAGNOSIS — Z21 Asymptomatic human immunodeficiency virus [HIV] infection status: Secondary | ICD-10-CM | POA: Insufficient documentation

## 2019-07-25 DIAGNOSIS — M79632 Pain in left forearm: Secondary | ICD-10-CM | POA: Insufficient documentation

## 2019-07-25 DIAGNOSIS — Z79899 Other long term (current) drug therapy: Secondary | ICD-10-CM | POA: Insufficient documentation

## 2019-07-25 DIAGNOSIS — J45909 Unspecified asthma, uncomplicated: Secondary | ICD-10-CM | POA: Insufficient documentation

## 2019-07-25 MED ORDER — OXYCODONE-ACETAMINOPHEN 5-325 MG PO TABS
1.0000 | ORAL_TABLET | Freq: Once | ORAL | Status: AC
  Administered 2019-07-25: 14:00:00 1 via ORAL
  Filled 2019-07-25: qty 1
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
## 2019-07-25 NOTE — Discharge Instructions (Signed)
Follow discharge care instruction and wear splint/wrist for 3 to 5 days as needed.  Reviewed  of your medical chart shows that you have adequate pain medication.

## 2019-12-15 ENCOUNTER — Emergency Department
Admission: EM | Admit: 2019-12-15 | Discharge: 2019-12-15 | Disposition: A | Discharge status: Home / Self Care | Payer: Self-pay | Attending: Emergency Medicine | Admitting: Emergency Medicine

## 2019-12-15 ENCOUNTER — Emergency Department: Payer: Self-pay

## 2019-12-15 ENCOUNTER — Other Ambulatory Visit: Payer: Self-pay

## 2019-12-15 DIAGNOSIS — Z21 Asymptomatic human immunodeficiency virus [HIV] infection status: Secondary | ICD-10-CM | POA: Insufficient documentation

## 2019-12-15 DIAGNOSIS — R220 Localized swelling, mass and lump, head: Secondary | ICD-10-CM

## 2019-12-15 DIAGNOSIS — J45909 Unspecified asthma, uncomplicated: Secondary | ICD-10-CM | POA: Insufficient documentation

## 2019-12-15 DIAGNOSIS — R519 Headache, unspecified: Secondary | ICD-10-CM

## 2019-12-15 DIAGNOSIS — Z79899 Other long term (current) drug therapy: Secondary | ICD-10-CM | POA: Insufficient documentation

## 2019-12-15 MED ORDER — TRAMADOL HCL 50 MG PO TABS
50.0000 mg | ORAL_TABLET | Freq: Once | ORAL | Status: DC
  Filled 2019-12-15: qty 1
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
## 2019-12-15 NOTE — ED Notes (Signed)
Pt given ice bag to apply to face.

## 2019-12-15 NOTE — ED Notes (Signed)
Pt presentation presented to Dr. Mayford Knife, verbal orders for CT's obtained and placed.

## 2019-12-15 NOTE — ED Notes (Signed)
Pt verbalized understanding of discharge instructions. NAD at this time. 
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
## 2019-12-15 NOTE — ED Notes (Signed)
Pt with c/o of left side facial pain. No bruising or swelling noted. Teeth and skin are intact.

## 2020-12-26 ENCOUNTER — Other Ambulatory Visit: Payer: Self-pay

## 2020-12-26 DIAGNOSIS — G43011 Migraine without aura, intractable, with status migrainosus: Secondary | ICD-10-CM | POA: Insufficient documentation

## 2020-12-26 DIAGNOSIS — Z21 Asymptomatic human immunodeficiency virus [HIV] infection status: Secondary | ICD-10-CM | POA: Insufficient documentation

## 2020-12-26 DIAGNOSIS — J45909 Unspecified asthma, uncomplicated: Secondary | ICD-10-CM | POA: Insufficient documentation

## 2020-12-26 NOTE — ED Triage Notes (Signed)
Pt presents c/o headache right right eye x2 days. Reports hx migraines. Does not take meds for migraines currently.

## 2020-12-26 NOTE — ED Notes (Signed)
Patient to stat desk desk in no acute distress asking about wait time. Patient given update on wait time. Patient verbalizes understanding.

## 2020-12-27 ENCOUNTER — Emergency Department
Admission: EM | Admit: 2020-12-27 | Discharge: 2020-12-27 | Disposition: A | Discharge status: Home / Self Care | Payer: Self-pay | Attending: Emergency Medicine | Admitting: Emergency Medicine

## 2020-12-27 DIAGNOSIS — G43911 Migraine, unspecified, intractable, with status migrainosus: Secondary | ICD-10-CM

## 2020-12-27 MED ORDER — PROCHLORPERAZINE EDISYLATE 10 MG/2ML IJ SOLN
10.0000 mg | INTRAMUSCULAR | Status: DC
  Filled 2020-12-27: qty 2

## 2020-12-27 MED ORDER — SODIUM CHLORIDE 0.9 % IV BOLUS: 500.0000 mL | Freq: Once | INTRAVENOUS | Status: DC

## 2020-12-27 MED ORDER — BUTALBITAL-APAP-CAFFEINE 50-325-40 MG PO TABS
2.0000 | ORAL_TABLET | ORAL | Status: AC
  Administered 2020-12-27: 2 via ORAL
  Filled 2020-12-27: qty 2

## 2020-12-27 MED ORDER — DEXAMETHASONE SODIUM PHOSPHATE 10 MG/ML IJ SOLN
10.0000 mg | Freq: Once | INTRAMUSCULAR | Status: DC
  Filled 2020-12-27: qty 1

## 2020-12-27 MED ORDER — ACETAMINOPHEN 325 MG PO TABS
325.0000 mg | ORAL_TABLET | Freq: Once | ORAL | Status: AC
  Administered 2020-12-27: 03:00:00 325 mg via ORAL
  Filled 2020-12-27: qty 1

## 2020-12-27 MED ORDER — DIPHENHYDRAMINE HCL 50 MG/ML IJ SOLN
12.5000 mg | INTRAMUSCULAR | Status: DC
  Filled 2020-12-27: qty 1

## 2020-12-27 MED ORDER — KETOROLAC TROMETHAMINE 30 MG/ML IJ SOLN: 15.0000 mg | Freq: Once | INTRAMUSCULAR | Status: DC

## 2020-12-27 NOTE — ED Notes (Signed)
Pt states coming in for a migraine for the last 2 days and took extra strength tylenol without relief. Pt states the pain is behind her right eye. Pt states sensitivity to light

## 2020-12-27 NOTE — ED Notes (Signed)
Dr. York Cerise at bedside talking with pt

## 2020-12-27 NOTE — ED Notes (Addendum)
Patient declined to wait any longer after medications were given and wanted to leave. Pt declined to observation wait time.  Pt signed discharge paperwork and left.

## 2020-12-27 NOTE — ED Provider Notes (Signed)
[facility] Emergency Department Provider Note  ____________________________________________   Event Date/Time   First MD Initiated Contact with Patient 12/27/20 0119     (approximate)  I have reviewed the triage vital signs and the nursing notes.   HISTORY  Chief Complaint Headache    HPI Tracy Bauer is a 35 y.o. female who presents for evaluation of 2 days of headache behind her eyes.  She says it feels the same as her usual migraines.  She is currently not taking any migraine medication or prophylaxis although she has in the past.  She said that she feels sensitive to light and that loud noises also make her headache worse.  She has had nausea but no vomiting.  She denies neck pain, neck stiffness, fever, sore throat, chest pain, shortness of breath, abdominal pain, and dysuria.  She describes the pain as severe and nothing in particular makes it better or worse.     Past Medical History:  Diagnosis Date   Asthma    HIV (human immunodeficiency virus infection) (HCC)    Sciatica     There are no problems to display for this patient.   Past Surgical History:  Procedure Laterality Date   CESAREAN SECTION      Prior to Admission medications   Medication Sig Start Date End Date Taking? Authorizing Provider  Elviteg-Cobic-Emtricit-TenofAF (GENVOYA PO) Take 1 tablet by mouth daily.    [provider]  meclizine (ANTIVERT) 25 MG tablet Take 1 tablet (25 mg total) by mouth 3 (three) times daily as needed for dizziness. 06/18/19   Verlee Monte, NP  metoCLOPramide (REGLAN) 10 MG tablet Take 1 tablet (10 mg total) by mouth every 8 (eight) hours as needed for nausea. 08/10/15 06/18/19  Rebecka Apley, MD    Allergies Nsaids and Penicillin g  Family History  Problem Relation Age of Onset   Diabetes Mother    Hypertension Mother    Other Father        unknown medical history    Social History Social History   Tobacco Use   Smoking  status: Never   Smokeless tobacco: Never   Tobacco comments:    vapes  Vaping Use   Vaping Use: Every day   Substances: Nicotine, Flavoring  Substance Use Topics   Alcohol use: No   Drug use: Never    Review of Systems Constitutional: No fever/chills Eyes: No visual changes. ENT: No sore throat. Cardiovascular: Denies chest pain. Respiratory: Denies shortness of breath. Gastrointestinal: Positive for nausea, negative for vomiting.  Negative for abdominal pain. Genitourinary: Negative for dysuria. Musculoskeletal: Negative for neck pain.  Negative for back pain. Integumentary: Negative for rash. Neurological: Pain behind eyes typical for her migraines.  Negative for numbness and weakness.   ____________________________________________   PHYSICAL EXAM:  VITAL SIGNS: ED Triage Vitals  Enc Vitals Group     BP 12/26/20 2130 128/88     Pulse Rate 12/26/20 2130 91     Resp 12/26/20 2130 16     Temp 12/26/20 2130 98.2 F (36.8 C)     Temp Source 12/26/20 2130 Oral     SpO2 12/26/20 2130 97 %     Weight --      Height --      Head Circumference --      Peak Flow --      Pain Score 12/26/20 2131 10     Pain Loc --      Pain Edu? --  Excl. in GC? --     Constitutional: Alert and oriented.  Eyes: Conjunctivae are normal.  Pupils are equal and reactive, extraocular motion is intact. Head: Atraumatic. Nose: No congestion/rhinnorhea. Mouth/Throat: Patient is wearing a mask. Neck: No stridor.  No meningeal signs.   Cardiovascular: Normal rate, regular rhythm. Good peripheral circulation. Respiratory: Normal respiratory effort.  No retractions. Gastrointestinal: Soft and nontender. No distention.  Musculoskeletal: No lower extremity tenderness nor edema. No gross deformities of extremities. Neurologic:  Normal speech and language. No gross focal neurologic deficits are appreciated.  Skin:  Skin is warm, dry and intact. Psychiatric: Mood and affect are normal. Speech  and behavior are normal.  ____________________________________________    INITIAL IMPRESSION / MDM / ASSESSMENT AND PLAN / ED COURSE  As part of my medical decision making, I reviewed the following data within the electronic MEDICAL RECORD NUMBER Nursing notes reviewed and incorporated, Old chart reviewed, Notes from prior ED visits, and [place] Controlled Substance Database   Differential diagnosis includes, but is not limited to, intracranial hemorrhage, meningitis/encephalitis, previous head trauma, cavernous venous thrombosis, tension headache, temporal arteritis, migraine or migraine equivalent, idiopathic intracranial hypertension, and non-specific headache.  The patient says this is typical of her usual migraine I reviewed her medical record and see that she gets prescriptions for Tylenol 3 from Dr. Sampson Goon but it is unclear to me for what this is prescribed.  She said that she is not currently taking anything like Imitrex.  No evidence of an emergent medical condition.  Vital signs are stable.  She is neurologically intact, and has symptoms typical of migraine.  I talked with her about the plan and I suggested we proceed with IV treatment with a "migraine cocktail": Compazine 10 mg IV, Benadryl 12.5 mg IV, Decadron 10 mg IV, normal saline 500 mL bolus, Toradol 15 mg IV.  The patient agreed with the plan.  No indication for emergent imaging at this time.       Clinical Course as of 12/27/20 6144  Wynelle Link Dec 27, 2020  0227 I was made aware of an incident that occurred as the patient woke up from sleeping before IV placement and before medication administration.  She woke up with some itching on the left side of her neck and felt some bumps.  She looked around all over the bed and states that she found a small bug that she believes to be a bedbug in the hospital blanket.  She was very upset and spoke with our nursing staff as well as with the hospital [employer] Maisie Fus.  We all attempted to point out that  we cannot be sure whether the bug was there or whether it came in with her, but she is convinced it was here (though the bed and bedding was cleaned and changed prior to her placement in the room just like always).  We were unable to accommodate her request for a different room because the emergency department is exceeding capacity at this time.  I assessed the lesions and she has what appears to be several (about 5) urticarial lesions on the left side of her neck.  These do not appear like classic bedbug bites, though perhaps it is because it is too soon.  I also saw a small insect on the blanket, and I can neither confirm nor deny that it is in fact a bedbug.  I offered her the choice of going ahead and moving forward with migraine treatment or I could give her some Fioricet  and the work note that she requested.  She decided that she would stay for treatment. [CF]  0229 Patient changed her mind and wants to leave.  I am giving her Fioricet x 2 and the work note she requested and encouraging close follow up.  Although she is refusing the IV treatment I am recommending, I think this is understandable and acceptable and does not represent an AMA situation. [CF]    Clinical Course User Index [CF] Loleta Rose, MD     ____________________________________________  FINAL CLINICAL IMPRESSION(S) / ED DIAGNOSES  Final diagnoses:  Intractable migraine with status migrainosus, unspecified migraine type     MEDICATIONS GIVEN DURING THIS VISIT:  Medications  diphenhydrAMINE (BENADRYL) injection 12.5 mg (12.5 mg Intravenous Patient Refused/Not Given 12/27/20 0244)  ketorolac (TORADOL) 30 MG/ML injection 15 mg (15 mg Intravenous Patient Refused/Not Given 12/27/20 0216)  dexamethasone (DECADRON) injection 10 mg (10 mg Intravenous Patient Refused/Not Given 12/27/20 0244)  sodium chloride 0.9 % bolus 500 mL (500 mLs Intravenous Patient Refused/Not Given 12/27/20 0244)  prochlorperazine (COMPAZINE) injection 10  mg (10 mg Intravenous Patient Refused/Not Given 12/27/20 0245)  butalbital-acetaminophen-caffeine (FIORICET) 50-325-40 MG per tablet 2 tablet (2 tablets Oral Given 12/27/20 0243)  acetaminophen (TYLENOL) tablet 325 mg (325 mg Oral Given 12/27/20 0244)     ED Discharge Orders     None        Note:  This document was prepared using Dragon voice recognition software and may include unintentional dictation errors.   Loleta Rose, MD 12/27/20 [phone]

## 2020-12-27 NOTE — ED Notes (Signed)
Pt states she has bed bugs and refusing to keep the room shut. AC has come and talked with patient and Consulting civil engineer notified.

## 2021-09-30 IMAGING — CT CT HEAD W/O CM
4 series · 15 of 47 positions shown, 17 images · non-contrast
Comparison: None.

CLINICAL DATA: Left-sided facial pain and headache after trauma

EXAM:
CT HEAD WITHOUT CONTRAST
CT MAXILLOFACIAL WITHOUT CONTRAST
TECHNIQUE: Multidetector CT imaging of the head and maxillofacial structures
were performed using the standard protocol without intravenous
contrast. Multiplanar CT image reconstructions of the maxillofacial
structures were also generated.

[Series 1: head bone · axial · 0.41mm/px · z∈[-89,-55]mm · 3 of 75 slices shown]
[im 8/75  bone]
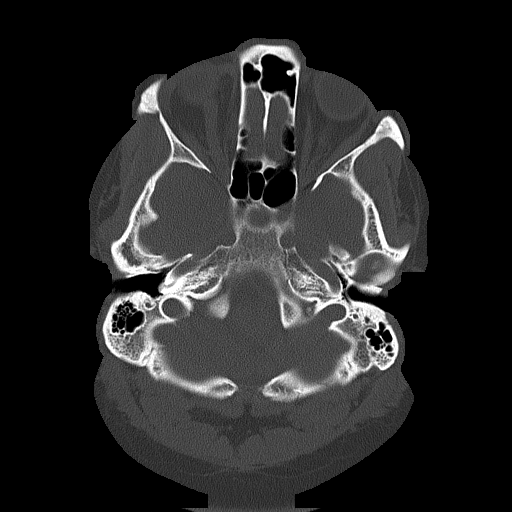
[im 15/75  bone]
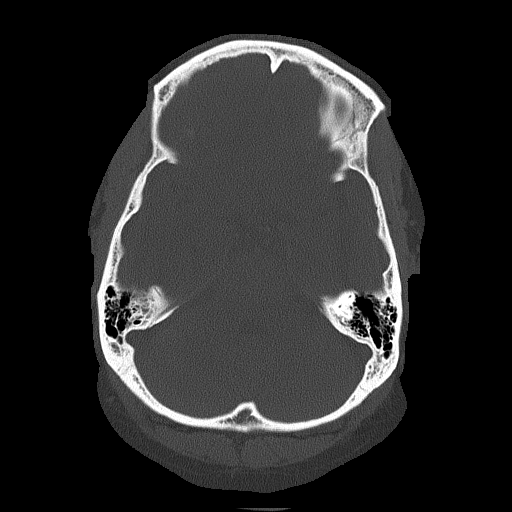
[im 25/75  bone]
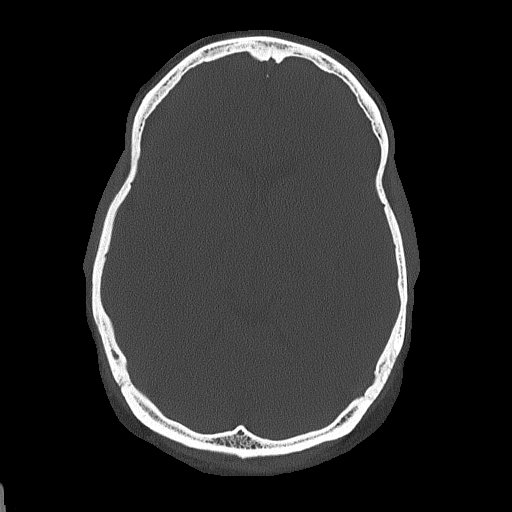

[Series 2: ax head wo · axial · 0.34mm/px · z∈[-79,+18]mm · 6 of 29 slices shown, 8 images]
[im 5/29  brain]
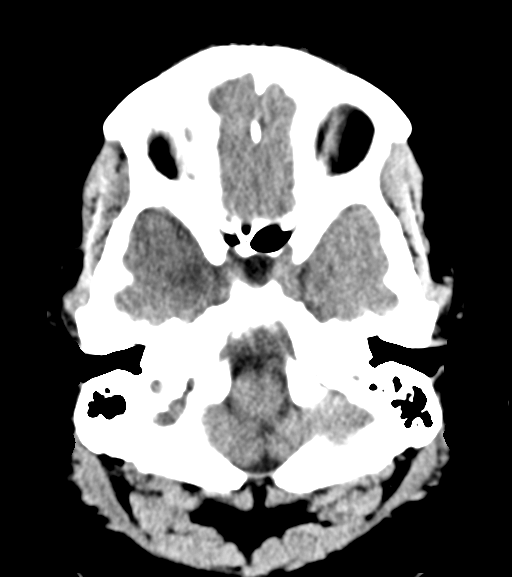
[im 5/29  bone]
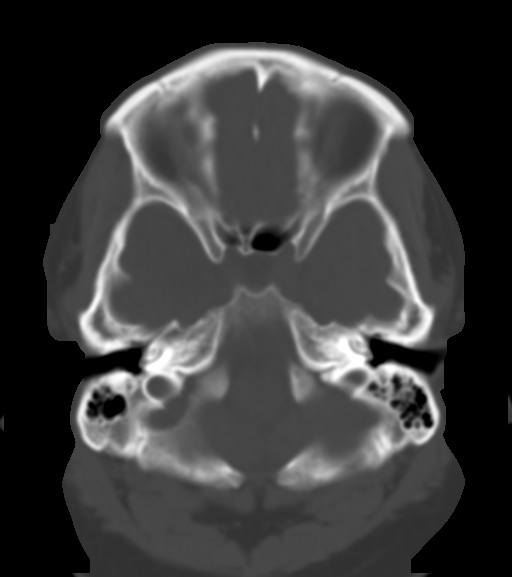
[im 9/29  brain]
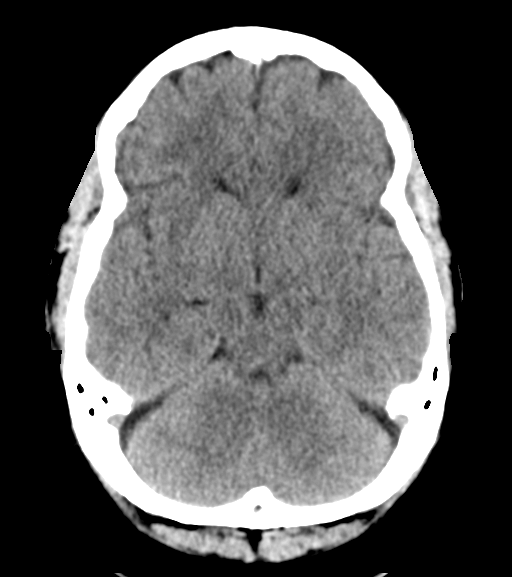
[im 13/29  brain]
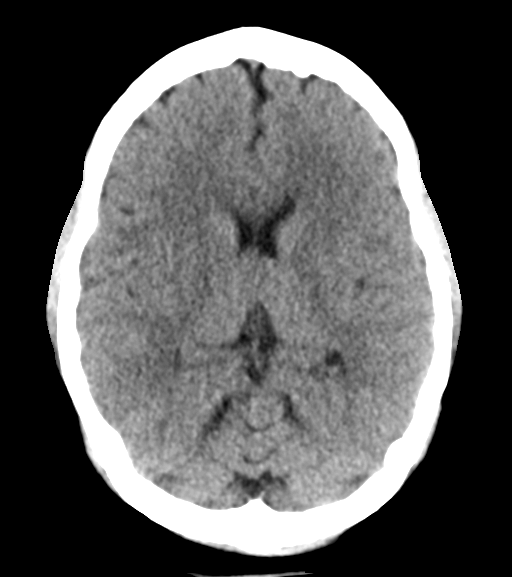
[im 17/29  brain]
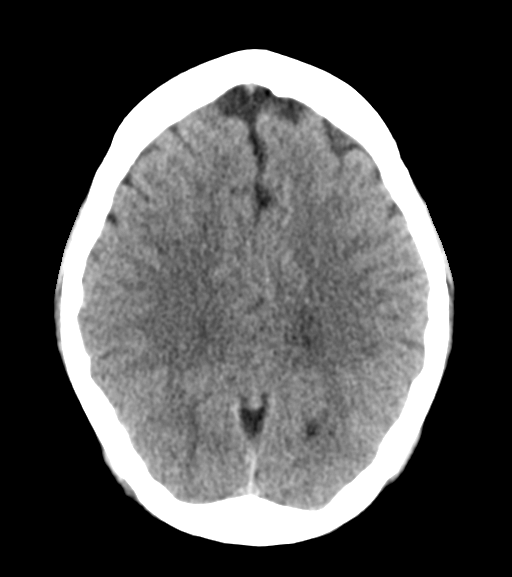
[im 21/29  brain]
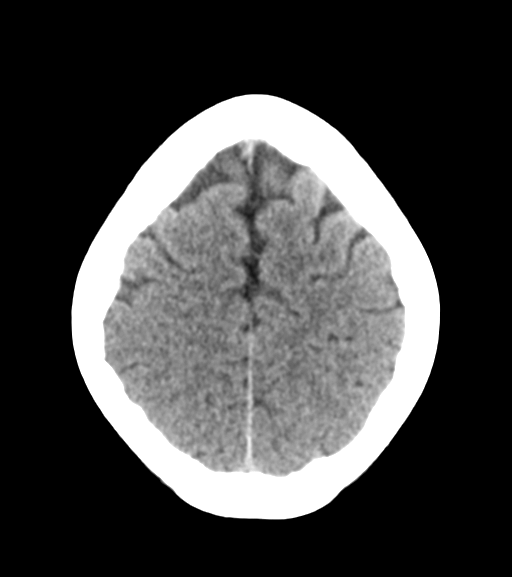
[im 21/29  bone]
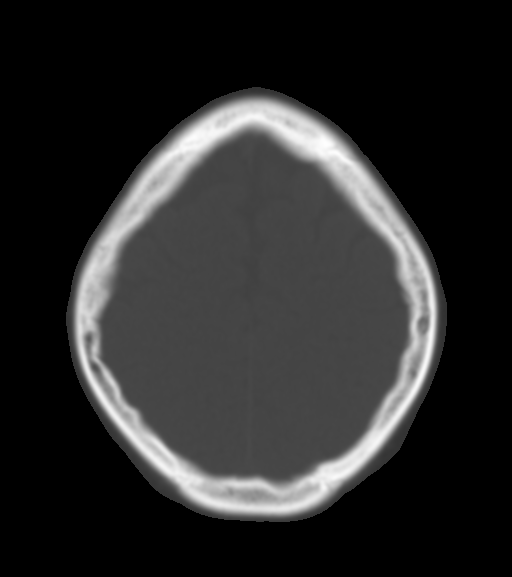
[im 25/29  brain]
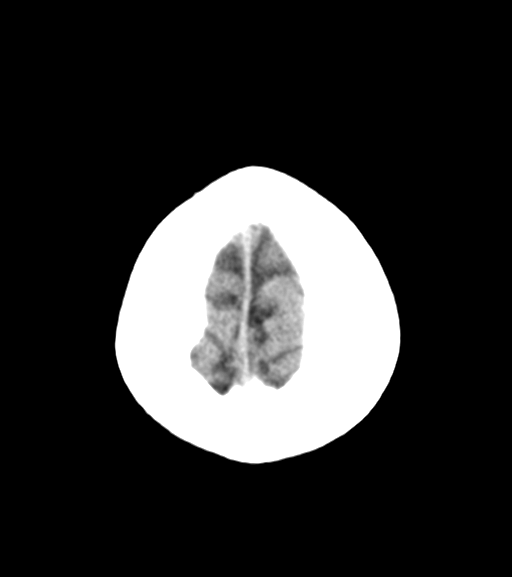

[Series 3: coronal soft tissue · coronal · 0.30mm/px · 3 of 58 slices shown]
[im 20/58  brain]
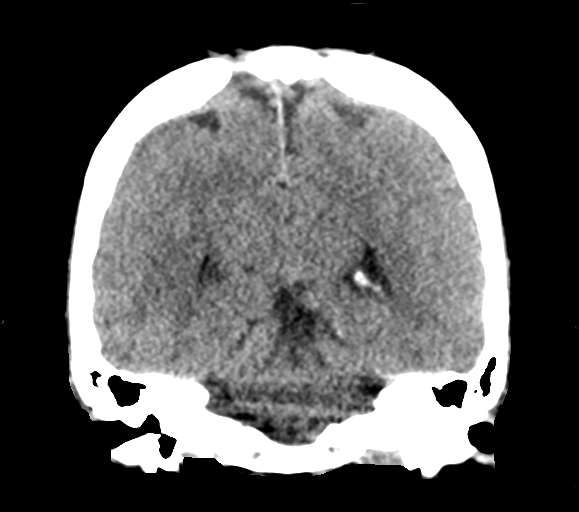
[im 26/58  brain]
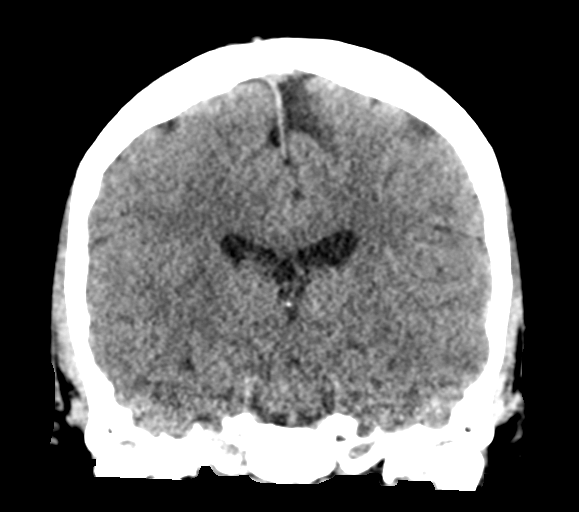
[im 32/58  brain]
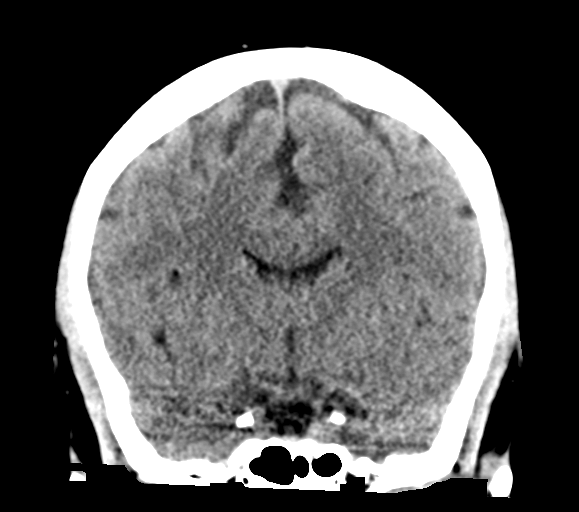

[Series 4: sagittal soft tissue · sagittal · 0.30mm/px · 3 of 47 slices shown]
[im 16/47  brain]
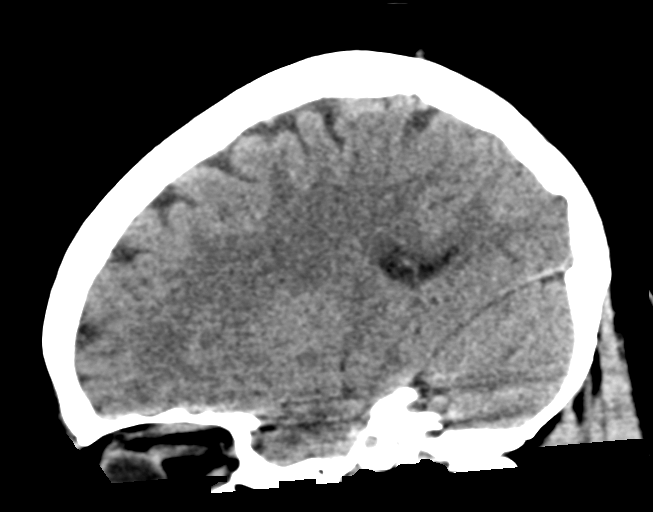
[im 24/47  brain]
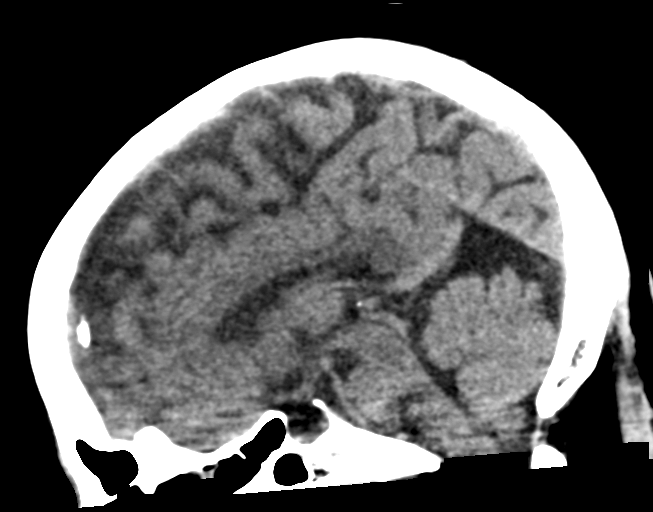
[im 31/47  brain]
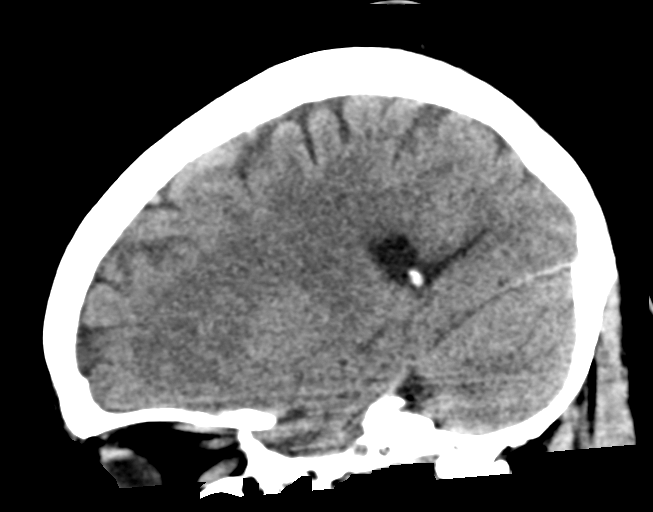

[15 of 47 positions shown; findings below may reference images not displayed]

FINDINGS: CT HEAD FINDINGS

Brain: No evidence of acute infarction, hemorrhage, hydrocephalus,
extra-axial collection or mass lesion/mass effect.

Vascular: No hyperdense vessel or unexpected calcification.

Skull: There are 2 small rounded calvarial defects within the high
right and left parietal regions (series 1, image 64; series 3, image
39) likely congenital or postsurgical. No overlying soft tissue
swelling to suggest sequela of recent trauma. No evidence of acute
calvarial fracture.

Other: No scalp hematoma.

CT MAXILLOFACIAL FINDINGS

Osseous: No acute maxillofacial bone fracture. Bony orbital walls
are intact. Mandible intact without fracture. Temporomandibular
joint alignment is maintained without dislocation.

Orbits: Negative. No traumatic or inflammatory finding.

Sinuses: Minimal mucosal thickening within the maxillary sinuses. No
air-fluid level or internal blood products. Mastoid air cells are
clear.

Soft tissues: No focal soft tissue swelling or hematoma.
IMPRESSION: 1. No acute intracranial findings.
2. No evidence of acute maxillofacial bone fracture.
3. Two small rounded calvarial defects within the high right and
left parietal regions, likely congenital or postsurgical. No
overlying soft tissue swelling to suggest sequela of recent trauma.
